# Patient Record
Sex: Female | Born: 1975 | Race: White | Hispanic: No | Smoking: Never smoker
Health system: Southern US, Community
[De-identification: ages and names within clinical notes are randomized; demographics above are authoritative.]

## PROBLEM LIST (undated history)

## (undated) DIAGNOSIS — F32A Depression, unspecified: Secondary | ICD-10-CM

## (undated) HISTORY — PX: ELBOW ARTHROPLASTY: SHX928

## (undated) HISTORY — DX: Depression, unspecified: F32.A

---

## 1998-03-05 ENCOUNTER — Inpatient Hospital Stay (HOSPITAL_COMMUNITY): Admission: AD | Admit: 1998-03-05 | Discharge: 1998-03-05 | Payer: Self-pay | Admitting: *Deleted

## 1998-03-13 ENCOUNTER — Inpatient Hospital Stay (HOSPITAL_COMMUNITY): Admission: AD | Admit: 1998-03-13 | Discharge: 1998-03-17 | Payer: Self-pay | Admitting: Obstetrics and Gynecology

## 2000-02-18 ENCOUNTER — Other Ambulatory Visit: Admission: RE | Admit: 2000-02-18 | Discharge: 2000-02-18 | Payer: Self-pay | Admitting: *Deleted

## 2000-09-08 ENCOUNTER — Inpatient Hospital Stay (HOSPITAL_COMMUNITY): Admission: AD | Admit: 2000-09-08 | Discharge: 2000-09-11 | Payer: Self-pay | Admitting: *Deleted

## 2000-10-23 ENCOUNTER — Other Ambulatory Visit: Admission: RE | Admit: 2000-10-23 | Discharge: 2000-10-23 | Payer: Self-pay | Admitting: *Deleted

## 2001-06-09 ENCOUNTER — Ambulatory Visit (HOSPITAL_COMMUNITY): Admission: RE | Admit: 2001-06-09 | Discharge: 2001-06-09 | Payer: Self-pay | Admitting: Obstetrics and Gynecology

## 2001-11-11 ENCOUNTER — Other Ambulatory Visit: Admission: RE | Admit: 2001-11-11 | Discharge: 2001-11-11 | Payer: Self-pay | Admitting: *Deleted

## 2005-03-14 ENCOUNTER — Other Ambulatory Visit: Admission: RE | Admit: 2005-03-14 | Discharge: 2005-03-14 | Payer: Self-pay | Admitting: Obstetrics and Gynecology

## 2010-03-07 ENCOUNTER — Encounter: Admission: RE | Admit: 2010-03-07 | Discharge: 2010-03-07 | Payer: Self-pay | Admitting: Obstetrics and Gynecology

## 2010-09-13 NOTE — Op Note (Signed)
[facility]  Patient:    Stacy Chandler, Stacy Chandler Visit Number: [id number] MRN: [id number]          Service Type: DSU Location: [facility] Attending Physician:  Marcelle Overlie Dictated by:   Marcelle Overlie, M.D. Proc. Date: 06/09/01 Admit Date:  06/09/2001                             Operative Report  PREOPERATIVE DIAGNOSIS:       Desires permanent sterilization.  POSTOPERATIVE DIAGNOSES:      Desires permanent sterilization.  PROCEDURE:                    Laparoscopic bilateral tubal ligation.  SURGEON:                      Marcelle Overlie, M.D.  ANESTHESIA:                   General.  ESTIMATED BLOOD LOSS:         Minimal.  PROCEDURE:                    Patient was taken to the operating room where she was intubated without difficulty.  She was then placed in the lithotomy position.  The abdomen, vagina, and vulva were prepped and draped in the usual sterile fashion and in-and-out catheter was used to empty the bladder.  The cervix was identified with a speculum and uterine manipulator was inserted into the uterus.  Attention was then turned to the abdomen where a small transverse incision was made at the umbilicus and the Verres needle was inserted into the peritoneal cavity with one attempt.  Pneumoperitoneum was achieved with maximal pressure of 15 mmHg.  The 10-11 mm trocar was inserted through the same incision.  The laparoscope was immediately introduced into the abdominal cavity.  There were no adhesions noted.  No bleeding or injury to the internal organs.  The patient was then placed in a Trendelenburg position and the pelvic organs were then inspected and noted to be normal uterus.  There were no adhesions noted.  Of note, patient had previously had a cesarean section.  Fallopian tubes appeared normal.  Bilateral fimbriated ends were visualized.  Bilateral ovaries appeared normal.  There were no adhesions in the cul-de-sac.  A secondary trocar  site was placed under direct guidance using a 5 mm trocar suprapubically and then using the Klepinger forceps the tubes were each sequentially lifted up and approximately a 3 cm mid portion segment was then electrocoagulated with the tension ______ went down to 0. After a good triple burn electrocautery this was performed on each side. Photographs were taken.  All instruments were removed from the abdomen and the pneumoperitoneum was released.  The incisions were closed using interrupted 3-0 Vicryl suture and local was placed at each incision site.  All sponge, lap, and instrument counts were correct x2.  Patient tolerated the procedure well and went to recovery room in stable condition. Dictated by:   Marcelle Overlie, M.D. Attending Physician:  Marcelle Overlie DD:  06/09/01 TD:  06/09/01 Job: [id number]

## 2010-09-13 NOTE — Op Note (Signed)
[facility]  Patient:    Stacy Chandler, Stacy Chandler                        MRN: [id number] Proc. Date: 09/08/00 Adm. Date:  [id number] Attending:  Donne Hazel                           Operative Report  PREOPERATIVE DIAGNOSES:       1. Intrauterine pregnancy at term.                               2. Repeat low transverse cesarean section.  POSTOPERATIVE DIAGNOSES:      1. Intrauterine pregnancy at term.                               2. Repeat low transverse cesarean section.  PROCEDURE:                    Repeat low transverse cesarean section.  SURGEON:                      Willey Blade, M.D.  ANESTHESIA:                   Spinal.  ESTIMATED BLOOD LOSS:         900 cc.  COMPLICATIONS:                None.  FINDINGS:                     At 32, through a low transverse incision, a viable female infant was delivered from the vertex presentation.  The babys weight is pending.  Apgars were 8 and 9.  The baby did well.  The pelvis was visualized at the time of surgery and noted to be normal.  DESCRIPTION OF PROCEDURE:     The patient was taken to the operating room, where a spinal anesthetic was administered.  The patient was placed on the operating table in the left lateral tilt position.  The abdomen was prepped and draped in the usual sterile fashion with Betadine and sterile drapes.  a Foley catheter was inserted.  The abdomen was entered through a low transverse incision and carried down sharply in the usual fashion.  The peritoneum was atraumatically entered.  The vesical peritoneum overlying the lower uterine segment was incised transversely and a bladder flap was bluntly and sharply created over the lower uterine segment.  A bladder blade was placed behind the bladder to ensure its protection during the procedure.  The uterus was then entered through a low transverse incision and carried out laterally using the operators fingers.  The  membranes were entered with clear fluid noted.  A Mityvac vacuum extraction device was used to elevate the head into the incision and the baby was promptly and easily delivered at 0753.  The oral and nasopharynx were thoroughly bulb suctioned and the cord doubly clamped and cut.  The baby was handed promptly to the pediatricians.  Apgars were 8 and 9. The babys weight is pending.  The baby did well.  The placenta was then manually extracted intact with three-vessel cord without difficulty.  The interior of the uterus was wiped thoroughly clean with a wet  sponge.  The uterine incision was then closed in a two-layer fashion.  The first layer was a running interlocking suture of #1 Vicryl.  A second imbricating suture was placed across the primary suture line with a running stitch of #1 Vicryl, as well.  The pelvis was then thoroughly irrigated and good hemostasis was noted.  The rectus muscle and anterior peritoneum was then closed with a running stitch with #1 Vicryl.  Good hemostasis was noted in the subfascial areas.  Prior to closure of the abdomen, the pelvis was thoroughly irrigated.  The pelvis was visualized and noted to be normal.  Next, the fascia was then closed with a running stitch of 0 Panacryl.  The subfascial areas were irrigated and made hemostatic using the Bovie cautery. The skin was reapproximated with staples and a sterile dressing applied.  Final sponge, needle and instrument counts were correct x 3,  There were no perioperative complications.  The patient did receive an antibiotic post cord clamp. DD:  09/08/00 TD:  09/08/00 Job: [id number]

## 2010-09-13 NOTE — Discharge Summary (Signed)
[facility]  Patient:    Stacy Chandler, Stacy Chandler                        MRN: [id number] Adm. Date:  09/08/00 Disc. Date: 09/11/00 Attending:  Donne Hazel Dictator:   Danie Chandler, R.N.                           Discharge Summary  ADMITTING DIAGNOSES:          1. Intrauterine pregnancy at term.                               2. ______ low transverse cesarean section.  DISCHARGE DIAGNOSES:          1. Intrauterine pregnancy at term.                               2. Repeat low transverse cesarean section.  PROCEDURE:                    On Sep 08, 2000: Repeat low transverse cesarean                               section.  REASON FOR ADMISSION:         Please see H&P.  HOSPITAL COURSE:              The patient was taken to the operating room and underwent the above-named procedure without complication. This was productive of a viable female infant with Apgars of 8 at one minute and 9 at five minutes. Postoperatively on day #1, the patient had adequate pain control. Her hemoglobin was 10.6, hematocrit 31.9, and white blood cell count 11.8. On postoperative day #2, the patient was tolerating regular diet and had a good return of bowel function. She was also ambulating well without difficulty. She was discharged home on postoperative day #3.  CONDITION ON DISCHARGE:       Good.  DIET:                         Regular as tolerated.  ACTIVITY:                     No heavy lifting, no driving, no vaginal entry.  DISCHARGE FOLLOWUP:           She is to follow up in the office for one to two weeks for incision check.  DISCHARGE INSTRUCTIONS:       She is to call for temperature greater than 100 degrees, persistent nausea or vomiting, heavy vaginal bleeding, and/or redness or drainage from the incision site.  DISCHARGE MEDICATIONS:        1. Prenatal vitamin one p.o. q.d.                               2. Tylox, #25, one to two p.o. q.4-6h.                      p.r.n. pain. DD:  09/11/00 TD:  09/11/00 Job: [id number]

## 2010-10-04 ENCOUNTER — Other Ambulatory Visit: Payer: Self-pay | Admitting: Obstetrics and Gynecology

## 2010-10-04 DIAGNOSIS — N6320 Unspecified lump in the left breast, unspecified quadrant: Secondary | ICD-10-CM

## 2010-10-14 ENCOUNTER — Ambulatory Visit
Admission: RE | Admit: 2010-10-14 | Discharge: 2010-10-14 | Disposition: A | Discharge status: Home / Self Care | Payer: 59 | Source: Ambulatory Visit | Attending: Obstetrics and Gynecology | Admitting: Obstetrics and Gynecology

## 2010-10-14 ENCOUNTER — Other Ambulatory Visit: Payer: Self-pay | Admitting: Obstetrics and Gynecology

## 2010-10-14 DIAGNOSIS — N6320 Unspecified lump in the left breast, unspecified quadrant: Secondary | ICD-10-CM

## 2011-01-02 ENCOUNTER — Other Ambulatory Visit: Payer: Self-pay | Admitting: Obstetrics and Gynecology

## 2011-01-02 DIAGNOSIS — R922 Inconclusive mammogram: Secondary | ICD-10-CM

## 2011-01-29 ENCOUNTER — Ambulatory Visit
Admission: RE | Admit: 2011-01-29 | Discharge: 2011-01-29 | Disposition: A | Discharge status: Home / Self Care | Payer: 59 | Source: Ambulatory Visit | Attending: Obstetrics and Gynecology | Admitting: Obstetrics and Gynecology

## 2011-01-29 DIAGNOSIS — R922 Inconclusive mammogram: Secondary | ICD-10-CM

## 2012-05-14 ENCOUNTER — Other Ambulatory Visit: Payer: Self-pay | Admitting: Obstetrics and Gynecology

## 2012-05-14 DIAGNOSIS — N63 Unspecified lump in unspecified breast: Secondary | ICD-10-CM

## 2012-05-20 ENCOUNTER — Other Ambulatory Visit: Payer: Self-pay | Admitting: Obstetrics and Gynecology

## 2012-05-20 ENCOUNTER — Ambulatory Visit
Admission: RE | Admit: 2012-05-20 | Discharge: 2012-05-20 | Disposition: A | Discharge status: Home / Self Care | Payer: 59 | Source: Ambulatory Visit | Attending: Obstetrics and Gynecology | Admitting: Obstetrics and Gynecology

## 2012-05-20 DIAGNOSIS — N63 Unspecified lump in unspecified breast: Secondary | ICD-10-CM

## 2014-06-25 ENCOUNTER — Emergency Department: Payer: Self-pay | Admitting: Internal Medicine

## 2014-06-27 ENCOUNTER — Ambulatory Visit: Payer: Self-pay | Admitting: Surgery

## 2014-08-27 NOTE — Op Note (Signed)
PATIENT NAME:  Stacy Chandler, Stacy Chandler MR#:  [id number] DATE OF BIRTH:  12-19-1975  DATE OF PROCEDURE:  06/27/2014  PREOPERATIVE DIAGNOSIS: Left proximal ulnar and radial neck fractures.   POSTOPERATIVE DIAGNOSIS:  Left proximal ulnar and radial neck fractures.  PROCEDURE: Open reduction and internal fixation of left proximal humerus fracture using a Biomet short precontoured proximal ulnar locking plate.   SURGEON:  Maryagnes Amos, M.D.   ASSISTANT: Devota Pace, NP.   ANESTHESIA: General endotracheal.   FINDINGS: As noted above.   COMPLICATIONS: None.   ESTIMATED BLOOD LOSS: Less than 25 mL.  TOTAL FLUIDS: 1000 mL of crystalloid.   TOURNIQUET TIME:  60 minutes at 250 mmHg.   DRAINS: None.   CLOSURE: Staples.   BRIEF CLINICAL NOTE: The patient is a 39 year old female who sustained the above-noted injury 2 days ago when she slipped and fell in her shower, landing on her left elbow. She presented to the Emergency Room where x-rays demonstrated the above noted findings. She was placed in a posterior splint and followed up in the office yesterday. She presents at this time for definitive management of her injury. Of note, the radial neck fracture is impacted, but overall well aligned, so it is not addressed.   PROCEDURE: The patient was brought into the Operating Room and lain in the supine position. After adequate general endotracheal intubation and anesthesia were obtained, the patient was rolled into the right lateral decubitus position and secured using a beanbag positioner. The left upper extremity was placed over a bolster. The left upper extremity was prepped with ChloraPrep solution before being draped sterilely. Preoperative antibiotics were administered. The limb was exsanguinated with an Esmarch and the tourniquet inflated to 250 mmHg. A curvilinear incision was made over the posterior aspect of the elbow and carried down through the subcutaneous tissues to expose the fascia overlying  the proximal end of the ulna. This was divided subperiosteally to expose the proximal end of the ulnar shaft as well as the olecranon process. The fracture was identified. It was debrided of soft tissues before it was aligned and temporarily stabilized with bone clamps. A short Biomet precontoured proximal ulna locking plate was applied over it. There were 3 holes distal to the fracture, so this was felt to be appropriate. A single locking screw was placed through the olecranon proximally. A bicortical screw was then placed in the slot distally in an eccentric fashion to allow compression across the fracture as it was tightened. Once the plate was aligned properly over the ulnar shaft, this was tightened securely to stabilize the fracture. The adequacy of fracture reduction was verified fluoroscopically in AP and lateral projections and found to be excellent. Next, the "homerun screw" was placed through the posterior aspect of the olecranon across the fracture deep to the joint so that it exited in the anterior cortex of the ulna. This screw was measured and a locking screw of appropriate length was positioned and tightened securely. Three additional screws were placed proximally, all of which were locking. Distally, two additional locking screws were placed bicortically. The adequacy of plate fixation, hardware position, and fracture reduction again was verified for using FluoroScan imaging in AP and lateral projections and found to be excellent.   The wound was copiously irrigated with sterile saline solution before the deep fascial layer was reapproximated using 2-0 Vicryl interrupted sutures. The subcutaneous tissues were closed using 2-0 Vicryl interrupted sutures before the skin was closed using staples. A total of 20 mL  of 0.5% Sensorcaine with epinephrine was injected in and around the incision to help with postoperative analgesia before a sterile bulky dressing was applied. The patient was placed into a  posterior splint with a sugar tong supplement before she was awakened, extubated, and returned to the recovery room in satisfactory condition after tolerating the procedure well.   ____________________________ J. Derald MacleodJeffrey Poggi, MD jjp:mc D: 06/27/2014 13:30:24 ET T: 06/27/2014 13:53:54 ET JOB#: [id number]  cc: Maryagnes AmosJ. Jeffrey Poggi, MD, <Dictator> Maryagnes AmosJ. JEFFREY POGGI MD ELECTRONICALLY SIGNED 06/29/2014 10:23

## 2015-01-03 ENCOUNTER — Emergency Department
Admission: EM | Admit: 2015-01-03 | Discharge: 2015-01-03 | Disposition: A | Discharge status: Home / Self Care | Payer: No Typology Code available for payment source | Attending: Emergency Medicine | Admitting: Emergency Medicine

## 2015-01-03 DIAGNOSIS — J020 Streptococcal pharyngitis: Secondary | ICD-10-CM | POA: Insufficient documentation

## 2015-01-03 DIAGNOSIS — J029 Acute pharyngitis, unspecified: Secondary | ICD-10-CM | POA: Diagnosis present

## 2015-01-03 MED ORDER — AMOXICILLIN 500 MG PO CAPS
500.0000 mg | ORAL_CAPSULE | Freq: Once | ORAL | Status: AC
  Administered 2015-01-03: 500 mg via ORAL

## 2015-01-03 MED ORDER — AMOXICILLIN 500 MG PO TABS: 500.0000 mg | ORAL_TABLET | Freq: Two times a day (BID) | ORAL | Status: DC

## 2015-01-03 MED ORDER — IBUPROFEN 800 MG PO TABS
800.0000 mg | ORAL_TABLET | Freq: Once | ORAL | Status: AC
  Administered 2015-01-03: 800 mg via ORAL
  Filled 2015-01-03: qty 1

## 2015-01-03 MED ORDER — IBUPROFEN 800 MG PO TABS: 800.0000 mg | ORAL_TABLET | Freq: Three times a day (TID) | ORAL | Status: DC | PRN

## 2015-01-03 MED ORDER — FLUCONAZOLE 150 MG PO TABS: 150.0000 mg | ORAL_TABLET | Freq: Every day | ORAL | Status: DC

## 2015-01-03 MED ORDER — AMOXICILLIN 500 MG PO CAPS
ORAL_CAPSULE | ORAL | Status: AC
  Filled 2015-01-03: qty 1

## 2015-01-03 NOTE — ED Notes (Signed)
POCT strep - negative

## 2015-01-03 NOTE — ED Provider Notes (Signed)
[facility] Emergency Department Provider Note     Time seen: ----------------------------------------- 10:25 PM on 01/03/2015 -----------------------------------------    I have reviewed the triage vital signs and the nursing notes.   HISTORY  Chief Complaint Sore Throat    HPI Stacy Chandler is a 39 y.o. female who presents ER sore throat since yesterday, no fever. She denies any runny nose, congestion, cough.   No past medical history on file.  There are no active problems to display for this patient.   No past surgical history on file.  Allergies Vicodin  Social History Social History  Substance Use Topics  . Smoking status: Not on file  . Smokeless tobacco: Not on file  . Alcohol Use: Not on file    Review of Systems Constitutional: Negative for fever. Eyes: Negative for visual changes. ENT: Positive for sore throat Cardiovascular: Negative for chest pain. Respiratory: Negative for shortness of breath. Gastrointestinal: Negative for abdominal pain, vomiting and diarrhea. Genitourinary: Negative for dysuria. Musculoskeletal: Negative for back pain. Skin: Negative for rash. Neurological: Negative for headaches, focal weakness or numbness.  10-point ROS otherwise negative.  ____________________________________________   PHYSICAL EXAM:  VITAL SIGNS: ED Triage Vitals  Enc Vitals Group     BP 01/03/15 2050 132/69 mmHg     Pulse Rate 01/03/15 2050 94     Resp 01/03/15 2050 18     Temp 01/03/15 2050 98.6 F (37 C)     Temp Source 01/03/15 2050 Oral     SpO2 01/03/15 2050 99 %     Weight 01/03/15 2054 200 lb (90.719 kg)     Height 01/03/15 2054  (1.575 m)     Head Cir --      Peak Flow --      Pain Score 01/03/15 2054 10     Pain Loc --      Pain Edu? --      Excl. in GC? --     Constitutional: Alert and oriented. Well appearing and in no distress. Eyes: Conjunctivae are normal. PERRL. Normal extraocular  movements. ENT   Head: Normocephalic and atraumatic.   Nose: No congestion/rhinnorhea.   Mouth/Throat: Mucous membranes are moist. Mildly erythematous posterior pharynx   Neck: No stridor. Mild anterior cervical adenopathy Cardiovascular: Normal rate, regular rhythm. Normal and symmetric distal pulses are present in all extremities. No murmurs, rubs, or gallops. Respiratory: Normal respiratory effort without tachypnea nor retractions. Breath sounds are clear and equal bilaterally. No wheezes/rales/rhonchi. Gastrointestinal: Soft and nontender. No distention. No abdominal bruits.  Musculoskeletal: Nontender with normal range of motion in all extremities. No joint effusions.  No lower extremity tenderness nor edema. Neurologic:  Normal speech and language. No gross focal neurologic deficits are appreciated. Speech is normal. No gait instability. Skin:  Skin is warm, dry and intact. No rash noted. Psychiatric: Mood and affect are normal. Speech and behavior are normal. Patient exhibits appropriate insight and judgment. ____________________________________________  ED COURSE:  Pertinent labs & imaging results that were available during my care of the patient were reviewed by me and considered in my medical decision making (see chart for details). Findings resemble viral pharyngitis, we'll check rapid strep. ____________________________________________    LABS (pertinent positives/negatives)  Rapid strep test is positive  ____________________________________________  FINAL ASSESSMENT AND PLAN  Strep pharyngitis  Plan: Patient with labs and imaging as dictated above. Although it does not truly resemble strep, she'll be given amoxicillin, Diflucan as she states antibiotic skin for yeast  infections and Motrin for pain. She stable for outpatient follow-up   Emily Filbert, MD   Emily Filbert, MD 01/03/15 2227

## 2015-01-03 NOTE — Discharge Instructions (Signed)

## 2015-01-03 NOTE — ED Notes (Signed)
Pt in with co sore throat since yest no fever.

## 2015-01-09 LAB — POCT RAPID STREP A: Streptococcus, Group A Screen (Direct): POSITIVE — AB

## 2015-07-24 ENCOUNTER — Other Ambulatory Visit: Payer: Self-pay | Admitting: Obstetrics and Gynecology

## 2015-07-24 DIAGNOSIS — N644 Mastodynia: Secondary | ICD-10-CM

## 2015-07-30 ENCOUNTER — Other Ambulatory Visit: Payer: Self-pay

## 2015-08-07 ENCOUNTER — Ambulatory Visit
Admission: RE | Admit: 2015-08-07 | Discharge: 2015-08-07 | Disposition: A | Discharge status: Home / Self Care | Payer: BLUE CROSS/BLUE SHIELD | Source: Ambulatory Visit | Attending: Obstetrics and Gynecology | Admitting: Obstetrics and Gynecology

## 2015-08-07 DIAGNOSIS — N644 Mastodynia: Secondary | ICD-10-CM

## 2015-10-17 ENCOUNTER — Encounter: Payer: Self-pay | Admitting: Emergency Medicine

## 2015-10-17 ENCOUNTER — Ambulatory Visit
Admission: EM | Admit: 2015-10-17 | Discharge: 2015-10-17 | Disposition: A | Discharge status: Home / Self Care | Payer: BLUE CROSS/BLUE SHIELD | Attending: Family Medicine | Admitting: Family Medicine

## 2015-10-17 DIAGNOSIS — J029 Acute pharyngitis, unspecified: Secondary | ICD-10-CM | POA: Diagnosis not present

## 2015-10-17 MED ORDER — PENICILLIN G BENZATHINE 1200000 UNIT/2ML IM SUSP
1.2000 10*6.[IU] | Freq: Once | INTRAMUSCULAR | Status: AC
  Administered 2015-10-17: 1.2 10*6.[IU] via INTRAMUSCULAR

## 2015-10-17 MED ORDER — FLUCONAZOLE 150 MG PO TABS: 150.0000 mg | ORAL_TABLET | Freq: Every day | ORAL | Status: DC

## 2015-10-17 NOTE — ED Notes (Signed)
Sore throat, fever, chills, sweating for 2 days

## 2015-10-17 NOTE — Discharge Instructions (Signed)

## 2015-10-17 NOTE — ED Notes (Signed)
San JettyLaurie Neese, RN checked patient and injection site and it appears to be unremarkable. Patient feels well.

## 2015-10-17 NOTE — ED Provider Notes (Signed)
CSN: [id number]     Arrival date & time 10/17/15  1506 History   First MD Initiated Contact with Patient 10/17/15 1634     Chief Complaint  Patient presents with  . Sore Throat   (Consider location/radiation/quality/duration/timing/severity/associated sxs/prior Treatment) Patient is a 40 y.o. female presenting with URI. The history is provided by the patient.  URI Presenting symptoms: fever and sore throat   Severity:  Moderate Onset quality:  Sudden Duration:  2 days Timing:  Constant Progression:  Worsening Relieved by:  Nothing Ineffective treatments:  OTC medications Associated symptoms: swollen glands   Associated symptoms: no headaches, no sinus pain and no wheezing   Risk factors: not elderly, no chronic cardiac disease, no chronic kidney disease, no chronic respiratory disease, no diabetes mellitus, no immunosuppression, no recent illness, no recent travel and no sick contacts     History reviewed. No pertinent past medical history. Past Surgical History  Procedure Laterality Date  . Elbow arthroplasty Left    No family history on file. Social History  Substance Use Topics  . Smoking status: Never Smoker   . Smokeless tobacco: Never Used  . Alcohol Use: Yes   OB History    No data available     Review of Systems  Constitutional: Positive for fever.  HENT: Positive for sore throat.   Respiratory: Negative for wheezing.   Neurological: Negative for headaches.    Allergies  Vicodin  Home Medications   Prior to Admission medications   Medication Sig Start Date End Date Taking? Authorizing Provider  amoxicillin (AMOXIL) 500 MG tablet Take 1 tablet (500 mg total) by mouth 2 (two) times daily. 01/03/15   Emily Filbert, MD  fluconazole (DIFLUCAN) 150 MG tablet Take 1 tablet (150 mg total) by mouth daily. 10/17/15   Payton Mccallum, MD  ibuprofen (ADVIL,MOTRIN) 800 MG tablet Take 1 tablet (800 mg total) by mouth every 8 (eight) hours as needed. 01/03/15   Emily Filbert, MD   Meds Ordered and Administered this Visit   Medications  penicillin g benzathine (BICILLIN LA) 1200000 UNIT/2ML injection 1.2 Million Units (1.2 Million Units Intramuscular Given 10/17/15 1646)    BP 115/62 mmHg  Pulse 82  Temp(Src) 98.2 F (36.8 C)  Resp 16  Ht  (1.575 m)  Wt 220 lb (99.791 kg)  BMI 40.23 kg/m2  SpO2 97% No data found.   Physical Exam  Constitutional: She appears well-developed and well-nourished. No distress.  HENT:  Head: Normocephalic and atraumatic.  Right Ear: Tympanic membrane, external ear and ear canal normal.  Left Ear: Tympanic membrane, external ear and ear canal normal.  Nose: Mucosal edema and rhinorrhea present. No nose lacerations, sinus tenderness, nasal deformity, septal deviation or nasal septal hematoma. No epistaxis.  No foreign bodies. Right sinus exhibits maxillary sinus tenderness and frontal sinus tenderness. Left sinus exhibits maxillary sinus tenderness and frontal sinus tenderness.  Mouth/Throat: Uvula is midline and mucous membranes are normal. Oropharyngeal exudate and posterior oropharyngeal erythema present. No posterior oropharyngeal edema or tonsillar abscesses.  Eyes: Conjunctivae and EOM are normal. Pupils are equal, round, and reactive to light. Right eye exhibits no discharge. Left eye exhibits no discharge. No scleral icterus.  Neck: Normal range of motion. Neck supple. No thyromegaly present.  Cardiovascular: Normal rate, regular rhythm and normal heart sounds.   Pulmonary/Chest: Effort normal and breath sounds normal. No respiratory distress. She has no wheezes. She has no rales.  Lymphadenopathy:    She has cervical adenopathy.  Skin: She is not diaphoretic.  Nursing note and vitals reviewed.   ED Course  Procedures (including critical care time)  Labs Review Labs Reviewed  RAPID STREP SCREEN (NOT AT [facility])    Imaging Review No results found.   Visual Acuity Review  Right Eye Distance:    Left Eye Distance:   Bilateral Distance:    Right Eye Near:   Left Eye Near:    Bilateral Near:         MDM   1. Pharyngitis    Discharge Medication List as of 10/17/2015  4:41 PM     1. diagnosis reviewed with patient 2.patient given Bicillin LA 1.762mU IM x 1 3. Recommend supportive treatment with salt water gargles, otc analgesics prn 4. Follow-up prn if symptoms worsen or don't improve    Payton Mccallumrlando Odessa Nishi, MD 10/17/15 1826

## 2021-01-09 ENCOUNTER — Telehealth: Payer: Self-pay

## 2021-01-09 NOTE — Telephone Encounter (Signed)
Left vm to confirm 01/10/21 appointment-Toni 

## 2021-01-10 ENCOUNTER — Ambulatory Visit (INDEPENDENT_AMBULATORY_CARE_PROVIDER_SITE_OTHER): Payer: BC Managed Care – PPO | Admitting: Physician Assistant

## 2021-01-10 ENCOUNTER — Encounter: Payer: Self-pay | Admitting: Physician Assistant

## 2021-01-10 ENCOUNTER — Other Ambulatory Visit: Payer: Self-pay

## 2021-01-10 DIAGNOSIS — F331 Major depressive disorder, recurrent, moderate: Secondary | ICD-10-CM | POA: Diagnosis not present

## 2021-01-10 DIAGNOSIS — Z3041 Encounter for surveillance of contraceptive pills: Secondary | ICD-10-CM

## 2021-01-10 DIAGNOSIS — F411 Generalized anxiety disorder: Secondary | ICD-10-CM | POA: Diagnosis not present

## 2021-01-10 DIAGNOSIS — G4710 Hypersomnia, unspecified: Secondary | ICD-10-CM | POA: Diagnosis not present

## 2021-01-10 DIAGNOSIS — Z7689 Persons encountering health services in other specified circumstances: Secondary | ICD-10-CM

## 2021-01-10 DIAGNOSIS — Z23 Encounter for immunization: Secondary | ICD-10-CM

## 2021-01-10 MED ORDER — VENLAFAXINE HCL 75 MG PO TABS: 75.0000 mg | ORAL_TABLET | Freq: Every day | ORAL | 1 refills | Status: DC

## 2021-01-10 MED ORDER — NORETHIN ACE-ETH ESTRAD-FE 1-20 MG-MCG(24) PO TABS: 1.0000 | ORAL_TABLET | Freq: Every day | ORAL | 2 refills | Status: DC

## 2021-01-10 MED ORDER — ESCITALOPRAM OXALATE 20 MG PO TABS: 20.0000 mg | ORAL_TABLET | Freq: Every day | ORAL | 1 refills | Status: DC

## 2021-01-10 NOTE — Progress Notes (Signed)
[facility] [address] [place], [place] [address]  Internal MEDICINE  Office Visit Note  Patient Name: Stacy Chandler  [id number]  [id number]  Date of Service: 01/13/2021   Complaints/HPI Pt is here for establishment of PCP. Chief Complaint  Patient presents with   New Patient (Initial Visit)   Depression   HPI Pt is here to establish care -Patient changed insurance and needs to establish with in-network provider -Takes lexapro and effexor and feels like this is helping with anxiety and depression  -She is taking b12 and vit D because these were low on labs done by previous provider. She is also on birth control pills. -She feels fatigued and has gained some weight. -Does not sleep great. She does snore but no gasping awake. Falls asleep around 8:30-9 and wakes up 5:30. Does toss and turn some. Brother has OSA. Can fall asleep during lunch break and usually has to set an alarm to get back to work on time. -She has 2 daughters, 19 and 22yo. 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   Cyanocobalamin (B-12 PO) Take by mouth.   Ergocalciferol (VITAMIN D2 PO) Take by mouth.   [DISCONTINUED] escitalopram (LEXAPRO) 20 MG tablet Take 20 mg by mouth daily.   [DISCONTINUED] Norethindrone Acetate-Ethinyl Estrad-FE (LOESTRIN 24  FE) 1-20 MG-MCG(24) tablet Take 1 tablet by mouth daily.   [DISCONTINUED] venlafaxine (EFFEXOR) 75 MG tablet Take 75 mg by mouth 2 (two) times daily.   escitalopram (LEXAPRO) 20 MG tablet Take 1 tablet (20 mg total) by mouth daily.   Norethindrone Acetate-Ethinyl Estrad-FE (LOESTRIN 24 FE) 1-20 MG-MCG(24) tablet Take 1 tablet by mouth daily.   venlafaxine (EFFEXOR) 75 MG tablet Take 1 tablet (75 mg total) by mouth daily.   [DISCONTINUED] amoxicillin (AMOXIL) 500 MG tablet Take 1 tablet (500 mg total) by mouth 2 (two) times daily.   [DISCONTINUED] fluconazole (DIFLUCAN) 150 MG tablet Take 1 tablet (150 mg total) by mouth daily.   [DISCONTINUED] ibuprofen (ADVIL,MOTRIN) 800 MG tablet Take 1 tablet (800 mg total) by mouth every 8 (eight) hours as needed.   No facility-administered encounter medications on file as of 01/10/2021.    Surgical History: Past Surgical History:  Procedure Laterality Date   CESAREAN SECTION     ELBOW ARTHROPLASTY Left     Medical History: Past Medical History:  Diagnosis Date   Depression     Family History: Family History  Problem Relation Age of Onset   Heart disease Mother    Diabetes Mother    Cancer Mother    Schizophrenia Brother    Cancer Maternal Grandmother    Cancer Paternal Grandmother  Social History   Socioeconomic History   Marital status: Married    Spouse name: Not on file   Number of children: Not on file   Years of education: Not on file   Highest education level: Not on file  Occupational History   Not on file  Tobacco Use   Smoking status: Never   Smokeless tobacco: Never  Substance and Sexual Activity   Alcohol use: Yes   Drug use: No   Sexual activity: Not on file  Other Topics Concern   Not on file  Social History Narrative   Not on file   Social Determinants of Health   Financial Resource Strain: Not on file  Food Insecurity: Not on file  Transportation Needs: Not on file  Physical Activity: Not on file   Stress: Not on file  Social Connections: Not on file  Intimate Partner Violence: Not on file     Review of Systems  Constitutional:  Positive for fatigue. Negative for chills and unexpected weight change.  HENT:  Negative for congestion, postnasal drip, rhinorrhea, sneezing and sore throat.   Eyes:  Negative for redness.  Respiratory:  Negative for cough, chest tightness and shortness of breath.   Cardiovascular:  Negative for chest pain and palpitations.  Gastrointestinal:  Negative for abdominal pain, constipation, diarrhea, nausea and vomiting.  Genitourinary:  Negative for dysuria and frequency.  Musculoskeletal:  Negative for arthralgias, back pain, joint swelling and neck pain.  Skin:  Negative for rash.  Neurological: Negative.  Negative for tremors and numbness.  Hematological:  Negative for adenopathy. Does not bruise/bleed easily.  Psychiatric/Behavioral:  Positive for behavioral problems (Depression) and sleep disturbance. Negative for suicidal ideas. The patient is nervous/anxious.    Vital Signs: BP 134/72   Pulse 95   Temp 98.1 F (36.7 C)   Resp 16   Ht 5\' 2"  (1.575 m)   Wt 262 lb 3.2 oz (118.9 kg)   SpO2 98%   BMI 47.96 kg/m    Physical Exam Vitals and nursing note reviewed.  Constitutional:      General: She is not in acute distress.    Appearance: She is well-developed. She is obese. She is not diaphoretic.  HENT:     Head: Normocephalic and atraumatic.     Mouth/Throat:     Pharynx: No oropharyngeal exudate.  Eyes:     Pupils: Pupils are equal, round, and reactive to light.  Neck:     Thyroid: No thyromegaly.     Vascular: No JVD.     Trachea: No tracheal deviation.  Cardiovascular:     Rate and Rhythm: Normal rate and regular rhythm.     Heart sounds: Normal heart sounds. No murmur heard.   No friction rub. No gallop.  Pulmonary:     Effort: Pulmonary effort is normal. No respiratory distress.     Breath sounds: No wheezing or rales.   Chest:     Chest wall: No tenderness.  Abdominal:     General: Bowel sounds are normal.     Palpations: Abdomen is soft.  Musculoskeletal:        General: Normal range of motion.     Cervical back: Normal range of motion and neck supple.  Lymphadenopathy:     Cervical: No cervical adenopathy.  Skin:    General: Skin is warm and dry.  Neurological:     Mental Status: She is alert and oriented to person, place, and time.     Cranial Nerves:  No cranial nerve deficit.  Psychiatric:        Behavior: Behavior normal.        Thought Content: Thought content normal.        Judgment: Judgment normal.      Assessment/Plan: 1. Hypersomnia Based on excessive daytime sleepiness, snoring, and elevated BMI will order PSG for evaluation of OSA. - PSG SLEEP STUDY  2. GAD (generalized anxiety disorder) Stable, will continue effexor and lexapro - venlafaxine (EFFEXOR) 75 MG tablet; Take 1 tablet (75 mg total) by mouth daily.  Dispense: 90 tablet; Refill: 1 - escitalopram (LEXAPRO) 20 MG tablet; Take 1 tablet (20 mg total) by mouth daily.  Dispense: 90 tablet; Refill: 1  3. Moderate episode of recurrent major depressive disorder (HCC) Stable, will continue effexor and lexapro - venlafaxine (EFFEXOR) 75 MG tablet; Take 1 tablet (75 mg total) by mouth daily.  Dispense: 90 tablet; Refill: 1 - escitalopram (LEXAPRO) 20 MG tablet; Take 1 tablet (20 mg total) by mouth daily.  Dispense: 90 tablet; Refill: 1  4. Encounter for surveillance of contraceptive pills - Norethindrone Acetate-Ethinyl Estrad-FE (LOESTRIN 24 FE) 1-20 MG-MCG(24) tablet; Take 1 tablet by mouth daily.  Dispense: 28 tablet; Refill: 2  5. Encounter to establish care with new doctor Pt here to establish care, already had CPE with previous provider this year  6. Needs flu shot - Flu Vaccine MDCK QUAD PF   General Counseling: Arielis verbalizes understanding of the findings of todays visit and agrees with plan of treatment. I have  discussed any further diagnostic evaluation that may be needed or ordered today. We also reviewed her medications today. she has been encouraged to call the office with any questions or concerns that should arise related to todays visit.    Counseling:    Orders Placed This Encounter  Procedures   Flu Vaccine MDCK QUAD PF   PSG SLEEP STUDY    Meds ordered this encounter  Medications   venlafaxine (EFFEXOR) 75 MG tablet    Sig: Take 1 tablet (75 mg total) by mouth daily.    Dispense:  90 tablet    Refill:  1   Norethindrone Acetate-Ethinyl Estrad-FE (LOESTRIN 24 FE) 1-20 MG-MCG(24) tablet    Sig: Take 1 tablet by mouth daily.    Dispense:  28 tablet    Refill:  2   escitalopram (LEXAPRO) 20 MG tablet    Sig: Take 1 tablet (20 mg total) by mouth daily.    Dispense:  90 tablet    Refill:  1     This patient was seen by Lynn Ito, PA-C in collaboration with Dr. Beverely Risen as a part of collaborative care agreement.   Time spent:40 Minutes

## 2021-01-18 ENCOUNTER — Encounter: Payer: Self-pay | Admitting: Physician Assistant

## 2021-01-21 ENCOUNTER — Telehealth: Payer: Self-pay

## 2021-01-21 NOTE — Telephone Encounter (Signed)
Called patietn and advised that we will be sending feeling [facility] an order for a home sleep study. No answer. Left a VM.

## 2021-01-22 NOTE — Telephone Encounter (Signed)
Okay 

## 2021-01-22 NOTE — Telephone Encounter (Signed)
Do we need to cancel her appointment, or do you still want to see her?

## 2021-01-23 ENCOUNTER — Telehealth: Payer: Self-pay

## 2021-01-23 NOTE — Telephone Encounter (Signed)
Patient has been scheduled a PSG for Sunday January 27, 2021. Verified with Betsy at [facility] that the patient will have no cost with a PSG.

## 2021-01-27 ENCOUNTER — Encounter (INDEPENDENT_AMBULATORY_CARE_PROVIDER_SITE_OTHER): Payer: BC Managed Care – PPO | Admitting: Internal Medicine

## 2021-01-27 DIAGNOSIS — G4733 Obstructive sleep apnea (adult) (pediatric): Secondary | ICD-10-CM

## 2021-01-31 DIAGNOSIS — G4733 Obstructive sleep apnea (adult) (pediatric): Secondary | ICD-10-CM | POA: Insufficient documentation

## 2021-01-31 NOTE — Procedures (Signed)
[facility]  Polysomnogram Report Part I                                                                 Phone: [phone] Fax: [phone]  Patient Name: Stacy Chandler, Stacy Chandler Acquisition Number: [id number]  Date of Birth: [birth year] Acquisition Date: 01/27/2021  Referring Physician: Lynn Ito, PA-C     History: The patient is a 45 year old female who was referred for evaluation of possible sleep apnea. Medical History: depression.  Medications: vitamin D3, B-12, D2, Lexapro, loestrin, Effexor.  Procedure: This routine overnight polysomnogram was performed on the Alice 5 using the standard diagnostic protocol. This included 6 channels of EEG, 2 channels of EOG, chin EMG, bilateral anterior tibialis EMG, nasal/oral thermistor, PTAF (nasal pressure transducer), chest and abdominal wall movements, EKG, and pulse oximetry.  Description: The total recording time was 451.9 minutes. The total sleep time was 418.7 minutes. There were a total of 20.5 minutes of wakefulness after sleep onset for a goodsleep efficiency of 92.7%. The latency to sleep onset was within normal limits at 12.7 minutes. The R sleep onset latency was prolonged at 321.0 minutes. Sleep parameters, as a percentage of the total sleep time, demonstrated 4.1% of sleep was in N1 sleep, 83.6% N2, 8.5% N3 and 3.8% R sleep. There were a total of 131 arousals for an arousal index of 18.8 arousals per hour of sleep that was elevated.  Respiratory monitoring demonstrated frequent moderate to severe degree of snoring. Less than 15 minutes of non-supine sleep were observed. There were 75 apneas and hypopneas for an Apnea Hypopnea Index of 10.7 apneas and hypopneas per hour of sleep. The REM related apnea hypopnea index was 45.0/hr of REM sleep compared to a NREM AHI of 9.2/hr.  The average duration of the respiratory events was 22.8 seconds with a maximum duration of 48.5 seconds.  The respiratory events were associated with  peripheral oxygen desaturations on the average to 91%. The lowest oxygen desaturation associated with a respiratory event was 84%. Additionally, the baseline oxygen saturation during wakefulness was 97%, during NREM sleep averaged 96%, and during REM sleep averaged  96%. The total duration of oxygen < 90% was 0.5 minutes.  Cardiac monitoring- did not demonstrate transient cardiac decelerations associated with the apneas. There were no significant cardiac rhythm irregularities.   Periodic limb movement monitoring- demonstrated that there were 87 periodic limb movements for a periodic limb movement index of 12.5 periodic limb movements per hour of sleep.   Impression: This routine overnight polysomnogram demonstrated significant obstructive sleep apnea with an overall Apnea Hypopnea Index of 10.7 apneas and hypopneas per hour of sleep. The respiratory events were more frequent in REM sleep with an AHI of 45.0. As REM percentage was markedly reduced, the findings likely underestimate the severity of the sleep apnea.  There was a slightly elevated periodic limb movement index of 12.5 periodic limb movements per hour of sleep. Sometimes these limb movements subside once the apnea is controlled. Clinical correlation is suggested.  There was an elevated arousal index with reduced percentages of REM and slow wave sleep. These findings would appear to be due to the obstructive sleep apnea.  Recommendations:    A CPAP titration would be recommended due to  the severity of the sleep apnea. Some supine sleep should be ensured to optimize the titration. Additionally, would recommend weight loss in a patient with a BMI of 47.9.     Yevonne Pax, MD, [facility] Diplomate ABMS-Pulmonary, Critical Care and Sleep Medicine  Electronically reviewed and digitally signed   [facility] Polysomnogram Report Part II  Phone: [phone] Fax: [phone]  Patient last name Chandler Neck Size 16.3 in.  Acquisition [id number]  Patient first name Stacy Weight 262.0 lbs. Started 01/27/2021 at 9:23:35 PM  Birth date 04-20-1976 Height 62.0 in. Stopped 01/28/2021 at 5:00:35 AM  Age 61 BMI 47.9 lb/in2 Duration 451.9  Study Type Adult      Report Generated by: Har[place], RPSGT Sleep Data: Lights Out: 9:27:53 PM Sleep Onset: 9:40:35 PM  Lights On: 4:59:47 AM Sleep Efficiency: 92.7 %  Total Recording Time: 451.9 min Sleep Latency (from Lights Off) 12.7 min  Total Sleep Time (TST): 418.7 min R Latency (from Sleep Onset): 321.0 min  Sleep Period Time: 439.2 min Total number of awakenings: 7  Wake during sleep: 20.5 min Wake After Sleep Onset (WASO): 20.5 min   Sleep Data:         Arousal Summary: Stage  Latency from lights out (min) Latency from sleep onset (min) Duration (min) % Total Sleep Time  Normal values  N 1 12.7 0.0 17.0 4.1 (5%)  N 2 23.7 11.0 350.2 83.6 (50%)  N 3 82.2 69.5 35.5 8.5 (20%)  R 333.7 321.0 16.0 3.8 (25%)    Number Index  Spontaneous 91 13.0  Apneas & Hypopneas 24 3.4  RERAs 0 0.0       (Apneas & Hypopneas & RERAs)  (24) (3.4)  Limb Movement 24 3.4  Snore 0 0.0  TOTAL 139 19.9     Respiratory Data:  CA OA MA Apnea Hypopnea* A+ H RERA Total  Number 0 3 0 3 72 75 0 75  Mean Dur (sec) 0.0 16.5 0.0 16.5 23.1 22.8 0.0 22.8  Max Dur (sec) 0.0 19.0 0.0 19.0 48.5 48.5 0.0 48.5  Total Dur (min) 0.0 0.8 0.0 0.8 27.7 28.5 0.0 28.5  % of TST 0.0 0.2 0.0 0.2 6.6 6.8 0.0 6.8  Index (#/h TST) 0.0 0.4 0.0 0.4 10.3 10.7 0.0 10.7  *Hypopneas scored based on 4% or greater desaturation.  Sleep Stage:        REM NREM TST  AHI 45.0 9.2 10.7  RDI 45.0 9.2 10.7           Body Position Data:  Sleep (min) TST (%) REM (min) NREM (min) CA (#) OA (#) MA (#) HYP (#) AHI (#/h) RERA (#) RDI (#/h) Desat (#)  Supine 404.2 96.54 16.0 388.2 0 3 0 71 11.0 0 11.0 78  Non-Supine 14.50 3.46 0.00 14.50 0.00 0.00 0.00 1.00 4.14 0 4.14 1.00  Right: 14.5 3.46 0.0 14.5 0 0 0 1  4.1 0 4.1 1  UP: 0.0 0.00 0.0 0.0 0 0 0 0 0.0 0 0.00 0     Snoring: Total number of snoring episodes  0  Total time with snoring    min (   % of sleep)   Oximetry Distribution:             WK REM NREM TOTAL  Average (%)   97 96 96 96  < 90% 0.1 0.2 0.2 0.5  < 80% 0.0 0.0 0.0 0.0  < 70% 0.0 0.0 0.0 0.0  #  of Desaturations* 1 15 63 79  Desat Index (#/hour) 2.0 56.3 9.4 11.3  Desat Max (%) 4 12 14 14   Desat Max Dur (sec) 19.0 33.0 61.0 61.0  Approx Min O2 during sleep 85  Approx min O2 during a respiratory event 84  Was Oxygen added (Y/N) and final rate No:   0 LPM  *Desaturations based on 4% or greater drop from baseline.   Cheyne Stokes Breathing: None Present   Heart Rate Summary:  Average Heart Rate During Sleep 77.3 bpm      Highest Heart Rate During Sleep (95th %) 85.0 bpm      Highest Heart Rate During Sleep 121 bpm      Highest Heart Rate During Recording (TIB) 169 bpm (artifact)   Heart Rate Observations: Event Type # Events   Bradycardia 0 Lowest HR Scored: N/A  Sinus Tachycardia During Sleep 0 Highest HR Scored: N/A  Narrow Complex Tachycardia 0 Highest HR Scored: N/A  Wide Complex Tachycardia 0 Highest HR Scored: N/A  Asystole 0 Longest Pause: N/A  Atrial Fibrillation 0 Duration Longest Event: N/A  Other Arrythmias  No Type:    Periodic Limb Movement Data: (Primary legs unless otherwise noted) Total # Limb Movement 135 Limb Movement Index 19.3  Total # PLMS 87 PLMS Index 12.5  Total # PLMS Arousals 15 PLMS Arousal Index 2.1  Percentage Sleep Time with PLMS 61. (14.7 % sleep)  Mean Duration limb movements (secs) 308.0

## 2021-02-04 ENCOUNTER — Encounter (INDEPENDENT_AMBULATORY_CARE_PROVIDER_SITE_OTHER): Payer: BC Managed Care – PPO | Admitting: Internal Medicine

## 2021-02-04 DIAGNOSIS — G4733 Obstructive sleep apnea (adult) (pediatric): Secondary | ICD-10-CM

## 2021-02-08 NOTE — Procedures (Signed)
[facility]  Polysomnogram Report Part I  Phone: [phone] Fax: [phone]  Patient Name: Stacy Chandler, Stacy Chandler Acquisition Number: [id number]  Date of Birth: 04-04-1976 Acquisition Date: 02/04/2021  Referring Physician: Lynn Ito, PA-C     History: The patient is a 45 year old female with obstructive sleep apnea for CPAP titration. Medical History: depression.  Medications: vitamin D3, vitamin B12, vitamin D2, Lexapro, loestrin 24 fe, Effexor.  Procedure: This routine overnight polysomnogram was performed on the Alice 5 using the standard CPAP protocol. This included 6 channels of EEG, 2 channels of EOG, chin EMG, bilateral anterior tibialis EMG, nasal/oral thermistor, PTAF (nasal pressure transducer), chest and abdominal wall movements, EKG, and pulse oximetry.  Description: The total recording time was 459.4 minutes. The total sleep time was 404.5 minutes. There were a total of 1.4 minutes of wakefulness after sleep onset for aslightly reducedsleep efficiency of 88.0%. The latency to sleep onset was prolonged at 53.5 minutes. The R sleep onset latency was within normal limits at 105.0 minutes. Sleep parameters, as a percentage of the total sleep time, demonstrated 1.1% of sleep was in N1 sleep, 45.9% N2, 15.0% N3 and 38.1% R sleep. There were a total of 25 arousals for an arousal index of 3.7 arousals per hour of sleep that was normal.  Overall, there were a total of 44 respiratory events for a respiratory disturbance index, which includes apneas, hypopneas and RERAs (increased respiratory effort) of 10.0 respiratory events per hour of sleep during the pressure titration. CPAP was initiated at 7 cm H2O for patient comfort at lights out, 9:23 p.m. It was titrated in 1 cm increments for occasional hypopneas to the final pressure of 11 cm H2O. The apnea was well controlled at the final pressure and REM sleep was observed. All sleep was in the supine position.  Additionally,  the baseline oxygen saturation during wakefulness was 96%, during NREM sleep averaged 95%, and during REM sleep averaged 96%. The total duration of oxygen < 90% was 0.0 minutes.  Cardiac monitoring- There were no significant cardiac rhythm irregularities.   Periodic limb movement monitoring- demonstrated that there were 68 periodic limb movements for a periodic limb movement index of 10.1 periodic limb movements per hour of sleep. Quasi-periodic limb movement were observed during periods of wakefulness.    Impression: This patient's obstructive sleep apnea demonstrated significant improvement with the utilization of nasal CPAP at 11 cm H2O.   There was a slightly elevated periodic limb movement index of 10.1 periodic limb movements per hour of sleep. In addition, quasi-periodic limb movement were observed during periods of wakefulness. These limb movements were also observed during the initial polysomnogram. Treatment may be indicated if sleep disruption or sleepiness persist once the patient is fully compliant with CPAP.  Recommendations: Would recommend utilization of nasal CPAP at 11 cm H2O.      A Simplus mask, size small, was used. Chin strap used during study- no. Humidifier used during study- yes.    Stacy Chandler, Stacy Chandler, Stacy Chandler, Stacy Chandler  Electronically reviewed and digitally signed    [facility] CPAP/BIPAP Polysomnogram Report Part II Phone: [phone] Fax: [phone]  Patient last name Chandler Neck Size 16.3 in. Acquisition [phone]  Patient first name Stacy Weight 262.0 lbs. Started 02/04/2021 at 9:14:13 PM  Birth date 10-21-1975 Height 62.0 in. Stopped 02/05/2021 at 5:03:49 AM  Age 46      Type Adult BMI 47.9  lb/in2 Duration 459.4  Report Generated by: [place], RPSGT Sleep Data: Lights Out: 9:23:43 PM Sleep Onset: 10:17:13 PM  Lights On: 5:03:07 AM Sleep Efficiency: 88.0 %  Total Recording Time: 459.4  min Sleep Latency (from Lights Off) 53.5 min  Total Sleep Time (TST): 404.5 min R Latency (from Sleep Onset): 105.0 min  Sleep Period Time: 405.5 min Total number of awakenings: 2  Wake during sleep: 1.0 min Wake After Sleep Onset (WASO): 1.4 min   Sleep Data:         Arousal Summary: Stage  Latency from lights out (min) Latency from sleep onset (min) Duration (min) % Total Sleep Time  Normal values  N 1 53.5 0.0 4.5 1.1 (5%)  N 2 56.0 2.5 185.5 45.9 (50%)  N 3 71.0 17.5 60.5 15.0 (20%)  R 158.5 105.0 154.0 38.1 (25%)    Number Index  Spontaneous 26 3.9  Apneas & Hypopneas 2 0.3  RERAs 0 0.0       (Apneas & Hypopneas & RERAs)  (2) (0.3)  Limb Movement 1 0.1  Snore 0 0.0  TOTAL 29 4.3     Respiratory Data:  CA OA MA Apnea Hypopnea* A+ H RERA Total  Number 0 0 0 0 16 16 0 16  Mean Dur (sec) 0.0 0.0 0.0 0.0 29.9 29.9 0.0 29.9  Max Dur (sec) 0.0 0.0 0.0 0.0 83.0 83.0 0.0 83.0  Total Dur (min) 0.0 0.0 0.0 0.0 8.0 8.0 0.0 8.0  % of TST 0.0 0.0 0.0 0.0 2.0 2.0 0.0 2.0  Index (#/h TST) 0.0 0.0 0.0 0.0 2.4 2.4 0.0 2.4  *Hypopneas scored based on 4% or greater desaturation.  Sleep Stage:         REM NREM TST  AHI 3.9 1.4 2.4  RDI 3.9 1.4 2.4    Sleep (min) TST (%) REM (min) NREM (min) CA (#) OA (#) MA (#) HYP (#) AHI (#/h) RERA (#) RDI (#/h) Desat (#)  Supine 404.5 100.00 154.0 250.5 0 0 0 16 2.4 0 2.4 30  Non-Supine 0.00 0.00 0.00 0.00 0.00 0.00 0.00 0.00 0.00 0 0.00 0.00     Snoring: Total number of snoring episodes  0  Total time with snoring    min (   % of sleep)   Oximetry Distribution:             WK REM NREM TOTAL  Average (%)   96 96 95 95  < 90% 0.0 0.0 0.0 0.0  < 80% 0.0 0.0 0.0 0.0  < 70% 0.0 0.0 0.0 0.0  # of Desaturations* 0 20 10 30   Desat Index (#/hour) 0.0 7.8 2.4 4.4  Desat Max (%) 0 8 6 8   Desat Max Dur (sec) 0.0 111.0 71.0 111.0  Approx Min O2 during sleep 88  Approx min O2 during a respiratory event 88  Was Oxygen added (Y/N) and  final rate No:   0 LPM  *Desaturations based on 3% or greater drop from baseline.   Cheyne Stokes Breathing: None Present    Heart Rate Summary:  Average Heart Rate During Sleep 85.5 bpm      Highest Heart Rate During Sleep (95th %) 92.0 bpm      Highest Heart Rate During Sleep 129 bpm      Highest Heart Rate During Recording (TIB) 149 bpm       Heart Rate Observations: Event Type # Events   Bradycardia 0 Lowest HR Scored: N/A  Sinus  Tachycardia During Sleep 0 Highest HR Scored: N/A  Narrow Complex Tachycardia 0 Highest HR Scored: N/A  Wide Complex Tachycardia 0 Highest HR Scored: N/A  Asystole 0 Longest Pause: N/A  Atrial Fibrillation 0 Duration Longest Event: N/A  Other Arrythmias  No Type:   Periodic Limb Movement Data: (Primary legs unless otherwise noted) Total # Limb Movement 114 Limb Movement Index 16.9  Total # PLMS 68 PLMS Index 10.1  Total # PLMS Arousals 1 PLMS Arousal Index 0.1  Percentage Sleep Time with PLMS 32.16min (8.0 % sleep)  Mean Duration limb movements (secs) 177.5    IPAP Level (cmH2O) EPAP Level (cmH2O) Total Duration (min) Sleep Duration (min) Sleep (%) REM (%) CA  #) OA # MA # HYP #) AHI (#/hr) RERAs # RERAs (#/hr) RDI (#/hr)  7 7 16.3 16.3 100.0 0.0 0 0 0 1 3.7 0 0.0 3.7  8 8  83.5 83.5 100.0 0.0 0 0 0 4 2.9 0 0.0 2.9  9 9  48.5 48.5 100.0 90.5 0 0 0 6 7.4 0 0.0 7.4  10 10  116.5 116.0 99.6 27.9 0 0 0 2 1.0 0 0.0 1.0  11 11 139.5 139.0 99.6 55.2 0 0 0 3 1.3 0 0.0 1.3

## 2021-02-14 ENCOUNTER — Ambulatory Visit: Payer: BC Managed Care – PPO | Admitting: Physician Assistant

## 2021-02-14 ENCOUNTER — Other Ambulatory Visit: Payer: Self-pay

## 2021-02-14 ENCOUNTER — Encounter: Payer: Self-pay | Admitting: Physician Assistant

## 2021-02-14 VITALS — BP 133/73 | HR 85 | Temp 98.1°F | Resp 16 | Ht 62.0 in | Wt 259.6 lb

## 2021-02-14 DIAGNOSIS — Z1231 Encounter for screening mammogram for malignant neoplasm of breast: Secondary | ICD-10-CM | POA: Diagnosis not present

## 2021-02-14 DIAGNOSIS — L720 Epidermal cyst: Secondary | ICD-10-CM | POA: Diagnosis not present

## 2021-02-14 DIAGNOSIS — G4733 Obstructive sleep apnea (adult) (pediatric): Secondary | ICD-10-CM

## 2021-02-14 NOTE — Progress Notes (Signed)
[facility] [address] [place], [place] [address]  Internal MEDICINE  Office Visit Note  Patient Name: Stacy Chandler  [id number]  [id number]  Date of Service: 02/15/2021  Chief Complaint  Patient presents with   Follow-up    Discuss SS   Depression    HPI Pt is here for routine follow up to review SS.  -PSG revealed an overall AHI of 10.7 apneas and hypopneas per hour of sleep however this increased to an AHI of 45 during REM.  Overall AHI likely reduced due to limited REM sleep observed during study.  Patient did also have some periodic leg movements throughout study.  Patient then went for titration study and was found to do well at his CPAP at 11 cm H2O.  Patient does report mask was not comfortable initially, but was not as sleepy the next day and therefore thinks that CPAP will be beneficial for her. -Patient also mentions that she has a cyst on forehead that has been getting bigger.it is in her hair line.  There are no signs of infection but the increasing size is bothersome to the patient and she would like to have this removed.  Will refer to dermatology for this -Patient is otherwise doing well and is not in need of any refills at this time. -She is due for mammogram and this will be ordered today  Current Medication: Outpatient Encounter Medications as of 02/14/2021  Medication Sig   Cholecalciferol (VITAMIN D3 PO) Take by mouth.   Cyanocobalamin (B-12 PO) Take by mouth.   Ergocalciferol (VITAMIN D2 PO) Take by mouth.   escitalopram (LEXAPRO) 20 MG tablet Take 1 tablet (20 mg total) by mouth daily.   Norethindrone Acetate-Ethinyl Estrad-FE (LOESTRIN 24 FE) 1-20 MG-MCG(24) tablet Take 1 tablet by mouth daily.   venlafaxine (EFFEXOR) 75 MG tablet Take 1 tablet (75 mg total) by mouth daily.   No facility-administered encounter medications on file as of 02/14/2021.    Surgical History: Past Surgical History:  Procedure Laterality Date   CESAREAN SECTION      ELBOW ARTHROPLASTY Left     Medical History: Past Medical History:  Diagnosis Date   Depression     Family History: Family History  Problem Relation Age of Onset   Heart disease Mother    Diabetes Mother    Cancer Mother    Schizophrenia Brother    Cancer Maternal Grandmother    Cancer Paternal Grandmother     Social History   Socioeconomic History   Marital status: Married    Spouse name: Not on file   Number of children: Not on file   Years of education: Not on file   Highest education level: Not on file  Occupational History   Not on file  Tobacco Use   Smoking status: Never   Smokeless tobacco: Never  Substance and Sexual Activity   Alcohol use: Yes   Drug use: No   Sexual activity: Not on file  Other Topics Concern   Not on file  Social History Narrative   Not on file   Social Determinants of Health   Financial Resource Strain: Not on file  Food Insecurity: Not on file  Transportation Needs: Not on file  Physical Activity: Not on file  Stress: Not on file  Social Connections: Not on file  Intimate Partner Violence: Not on file      Review of Systems  Constitutional:  Positive for fatigue. Negative for chills and unexpected weight change.  HENT:  Negative for congestion, postnasal drip, rhinorrhea, sneezing and sore throat.   Eyes:  Negative for redness.  Respiratory:  Negative for cough, chest tightness and shortness of breath.   Cardiovascular:  Negative for chest pain and palpitations.  Gastrointestinal:  Negative for abdominal pain, constipation, diarrhea, nausea and vomiting.  Genitourinary:  Negative for dysuria and frequency.  Musculoskeletal:  Negative for arthralgias, back pain, joint swelling and neck pain.  Skin:  Negative for rash.       Cyst on scalp  Neurological: Negative.  Negative for tremors and numbness.  Hematological:  Negative for adenopathy. Does not bruise/bleed easily.  Psychiatric/Behavioral:  Positive for behavioral  problems (Depression) and sleep disturbance. Negative for suicidal ideas. The patient is nervous/anxious.    Vital Signs: BP 133/73   Pulse 85   Temp 98.1 F (36.7 C)   Resp 16   Ht 5\' 2"  (1.575 m)   Wt 259 lb 9.6 oz (117.8 kg)   SpO2 98%   BMI 47.48 kg/m    Physical Exam Vitals and nursing note reviewed.  Constitutional:      General: She is not in acute distress.    Appearance: She is well-developed. She is obese. She is not diaphoretic.  HENT:     Head: Normocephalic and atraumatic.     Mouth/Throat:     Pharynx: No oropharyngeal exudate.  Eyes:     Pupils: Pupils are equal, round, and reactive to light.  Neck:     Thyroid: No thyromegaly.     Vascular: No JVD.     Trachea: No tracheal deviation.  Cardiovascular:     Rate and Rhythm: Normal rate and regular rhythm.     Heart sounds: Normal heart sounds. No murmur heard.   No friction rub. No gallop.  Pulmonary:     Effort: Pulmonary effort is normal. No respiratory distress.     Breath sounds: No wheezing or rales.  Chest:     Chest wall: No tenderness.  Abdominal:     General: Bowel sounds are normal.     Palpations: Abdomen is soft.  Musculoskeletal:        General: Normal range of motion.     Cervical back: Normal range of motion and neck supple.  Lymphadenopathy:     Cervical: No cervical adenopathy.  Skin:    General: Skin is warm and dry.  Neurological:     Mental Status: She is alert and oriented to person, place, and time.     Cranial Nerves: No cranial nerve deficit.  Psychiatric:        Behavior: Behavior normal.        Thought Content: Thought content normal.        Judgment: Judgment normal.       Assessment/Plan: 1. OSA (obstructive sleep apnea) Will order CPAP at 11cm H2O - For home use only DME continuous positive airway pressure (CPAP)  2. Epidermal cyst Will refer to dermatology - Ambulatory referral to Dermatology  3. Visit for screening mammogram - MM DIGITAL SCREENING  BILATERAL; Future   General Counseling: kamira mellette understanding of the findings of todays visit and agrees with plan of treatment. I have discussed any further diagnostic evaluation that may be needed or ordered today. We also reviewed her medications today. she has been encouraged to call the office with any questions or concerns that should arise related to todays visit.    Orders Placed This Encounter  Procedures   For home use only  DME continuous positive airway pressure (CPAP)   MM DIGITAL SCREENING BILATERAL   Ambulatory referral to Dermatology    No orders of the defined types were placed in this encounter.   This patient was seen by Lynn Ito, PA-C in collaboration with Dr. Beverely Risen as a part of collaborative care agreement.   Total time spent:30 Minutes Time spent includes review of chart, medications, test results, and follow up plan with the patient.      Dr Lyndon Code Internal medicine

## 2021-02-15 ENCOUNTER — Telehealth: Payer: Self-pay

## 2021-02-15 NOTE — Patient Instructions (Signed)
Living With Sleep Apnea Sleep apnea is a condition in which breathing pauses or becomes shallow during sleep. Sleep apnea is most commonly caused by a collapsed or blocked airway. People with sleep apnea usually snore loudly. They may have times when they gasp and stop breathing for 10 seconds or more during sleep. This may happenmany times during the night. The breaks in breathing also interrupt the deep sleep that you need to feel rested. Even if you do not completely wake up from the gaps in breathing, your sleep may not be restful and you feel tired during the day. You may also have a headache in the morning and low energy during the day, and you may feel anxiousor depressed. How can sleep apnea affect me? Sleep apnea increases your chances of extreme tiredness during the day (daytime fatigue). It can also increase your risk for health conditions, such as: Heart attack. Stroke. Obesity. Type 2 diabetes. Heart failure. Irregular heartbeat. High blood pressure. If you have daytime fatigue as a result of sleep apnea, you may be more likely to: Perform poorly at school or work. Fall asleep while driving. Have difficulty with attention. Develop depression or anxiety. Have sexual dysfunction. What actions can I take to manage sleep apnea? Sleep apnea treatment  If you were given a device to open your airway while you sleep, use it only as told by your health care provider. You may be given: An oral appliance. This is a custom-made mouthpiece that shifts your lower jaw forward. A continuous positive airway pressure (CPAP) device. This device blows air through a mask when you breathe out (exhale). A nasal expiratory positive airway pressure (EPAP) device. This device has valves that you put into each nostril. A bi-level positive airway pressure (BPAP) device. This device blows air through a mask when you breathe in (inhale) and breathe out (exhale). You may need surgery if other treatments do  not work for you.  Sleep habits Go to sleep and wake up at the same time every day. This helps set your internal clock (circadian rhythm) for sleeping. If you stay up later than usual, such as on weekends, try to get up in the morning within 2 hours of your normal wake time. Try to get at least 7-9 hours of sleep each night. Stop using a computer, tablet, and mobile phone a few hours before bedtime. Do not take long naps during the day. If you nap, limit it to 30 minutes. Have a relaxing bedtime routine. Reading or listening to music may relax you and help you sleep. Use your bedroom only for sleep. Keep your television and computer out of your bedroom. Keep your bedroom cool, dark, and quiet. Use a supportive mattress and pillows. Follow your health care provider's instructions for other changes to sleep habits. Nutrition Do not eat heavy meals in the evening. Do not have caffeine in the later part of the day. The effects of caffeine can last for more than 5 hours. Follow your health care provider's or dietitian's instructions for any diet changes. Lifestyle     Do not drink alcohol before bedtime. Alcohol can cause you to fall asleep at first, but then it can cause you to wake up in the middle of the night and have trouble getting back to sleep. Do not use any products that contain nicotine or tobacco. These products include cigarettes, chewing tobacco, and vaping devices, such as e-cigarettes. If you need help quitting, ask your health care provider. Medicines Take over-the-counter   and prescription medicines only as told by your health care provider. Do not use over-the-counter sleep medicine. You can become dependent on this medicine, and it can make sleep apnea worse. Do not use medicines, such as sedatives and narcotics, unless told by your health care provider. Activity Exercise on most days, but avoid exercising in the evening. Exercising near bedtime can interfere with  sleeping. If possible, spend time outside every day. Natural light helps regulate your circadian rhythm. General information Lose weight if you need to, and maintain a healthy weight. Keep all follow-up visits. This is important. If you are having surgery, make sure to tell your health care provider that you have sleep apnea. You may need to bring your device with you. Where to find more information Learn more about sleep apnea and daytime fatigue from: American Sleep Association: sleepassociation.org National Sleep Foundation: sleepfoundation.org National Heart, Lung, and Blood Institute: nhlbi.nih.gov Summary Sleep apnea is a condition in which breathing pauses or becomes shallow during sleep. Sleep apnea can cause daytime fatigue and other serious health conditions. You may need to wear a device while sleeping to help keep your airway open. If you are having surgery, make sure to tell your health care provider that you have sleep apnea. You may need to bring your device with you. Making changes to sleep habits, diet, lifestyle, and activity can help you manage sleep apnea. This information is not intended to replace advice given to you by your health care provider. Make sure you discuss any questions you have with your healthcare provider. Document Revised: 03/23/2020 Document Reviewed: 03/23/2020 Elsevier Patient Education  2022 Elsevier Inc.  

## 2021-02-15 NOTE — Telephone Encounter (Signed)
Dermatology referral manually faxed to [facility] [phone]

## 2021-02-15 NOTE — Telephone Encounter (Signed)
Sent CPAP order to [facility]. Waiting for processing. They will process and contact patient.

## 2021-02-19 ENCOUNTER — Telehealth: Payer: Self-pay

## 2021-02-19 NOTE — Telephone Encounter (Signed)
Order for Cpap machine has been sent out to [facility] via community messages.

## 2021-02-28 ENCOUNTER — Encounter: Payer: Self-pay | Admitting: Physician Assistant

## 2021-03-05 NOTE — Telephone Encounter (Signed)
Appointment for surgery 05/02/21 @ 3:00-Toni

## 2021-03-27 ENCOUNTER — Telehealth: Payer: Self-pay

## 2021-03-27 NOTE — Telephone Encounter (Signed)
Contacted patient to advice that [facility] has their order for cpap equipment however they have no estimated time frame on when they they will get a machine in. I contacted the patient and left a vm with this information. Left a call back number for [facility] of [phone].

## 2021-04-08 ENCOUNTER — Telehealth: Payer: Self-pay

## 2021-04-08 NOTE — Telephone Encounter (Signed)
Verbal order for cpap supplies signed by provider and placed in [facility] folder.

## 2021-04-16 ENCOUNTER — Other Ambulatory Visit: Payer: Self-pay | Admitting: Physician Assistant

## 2021-04-16 DIAGNOSIS — Z3041 Encounter for surveillance of contraceptive pills: Secondary | ICD-10-CM

## 2021-05-16 ENCOUNTER — Other Ambulatory Visit: Payer: Self-pay

## 2021-05-16 ENCOUNTER — Ambulatory Visit (INDEPENDENT_AMBULATORY_CARE_PROVIDER_SITE_OTHER): Payer: BC Managed Care – PPO | Admitting: Physician Assistant

## 2021-05-16 ENCOUNTER — Encounter: Payer: Self-pay | Admitting: Physician Assistant

## 2021-05-16 VITALS — BP 148/86 | HR 93 | Temp 98.0°F | Resp 16 | Ht 62.0 in | Wt 259.2 lb

## 2021-05-16 DIAGNOSIS — G4733 Obstructive sleep apnea (adult) (pediatric): Secondary | ICD-10-CM

## 2021-05-16 DIAGNOSIS — F411 Generalized anxiety disorder: Secondary | ICD-10-CM | POA: Diagnosis not present

## 2021-05-16 DIAGNOSIS — F331 Major depressive disorder, recurrent, moderate: Secondary | ICD-10-CM

## 2021-05-16 DIAGNOSIS — E559 Vitamin D deficiency, unspecified: Secondary | ICD-10-CM

## 2021-05-16 DIAGNOSIS — R5383 Other fatigue: Secondary | ICD-10-CM

## 2021-05-16 DIAGNOSIS — E782 Mixed hyperlipidemia: Secondary | ICD-10-CM | POA: Diagnosis not present

## 2021-05-16 DIAGNOSIS — E538 Deficiency of other specified B group vitamins: Secondary | ICD-10-CM

## 2021-05-16 DIAGNOSIS — R7301 Impaired fasting glucose: Secondary | ICD-10-CM

## 2021-05-16 NOTE — Progress Notes (Signed)
[facility] [address] [place], [place] [address]  Internal MEDICINE  Office Visit Note  Patient Name: Stacy Chandler  [id number]  [id number]  Date of Service: 05/24/2021  Chief Complaint  Patient presents with   Follow-up   Depression   Quality Metric Gaps    Pap Smear and Colonoscopy    HPI Pt is here for routine follow up -Has been having more anxiety lately. Conflict with coworkers is contributing to this. May try increasing Lexapro. Patient would also like to establish with psych and try counseling -working on cpap, cannot afford at this time, but is working to save up for it as she knows she needs it felt much better after one night of use in the lab -Cyst successfully removed by dermatology and well healed -Dentist next month -Due for routine fasting labs prior to CPE  Current Medication: Outpatient Encounter Medications as of 05/16/2021  Medication Sig   escitalopram (LEXAPRO) 20 MG tablet Take 1 tablet (20 mg total) by mouth daily.   LARIN 24 FE 1-20 MG-MCG(24) tablet Take 1 tablet by mouth once daily   venlafaxine (EFFEXOR) 75 MG tablet Take 1 tablet (75 mg total) by mouth daily.   Cholecalciferol (VITAMIN D3 PO) Take by mouth.   Cyanocobalamin (B-12 PO) Take by mouth.   Ergocalciferol (VITAMIN D2 PO) Take by mouth.   No facility-administered encounter medications on file as of 05/16/2021.    Surgical History: Past Surgical History:  Procedure Laterality Date   CESAREAN SECTION     ELBOW ARTHROPLASTY Left     Medical History: Past Medical History:  Diagnosis Date   Depression     Family History: Family History  Problem Relation Age of Onset   Heart disease Mother    Diabetes Mother    Cancer Mother    Schizophrenia Brother    Cancer Maternal Grandmother    Cancer Paternal Grandmother     Social History   Socioeconomic History   Marital status: Married    Spouse name: Not on file   Number of children: Not on file   Years of  education: Not on file   Highest education level: Not on file  Occupational History   Not on file  Tobacco Use   Smoking status: Never   Smokeless tobacco: Never  Substance and Sexual Activity   Alcohol use: Yes   Drug use: No   Sexual activity: Not on file  Other Topics Concern   Not on file  Social History Narrative   Not on file   Social Determinants of Health   Financial Resource Strain: Not on file  Food Insecurity: Not on file  Transportation Needs: Not on file  Physical Activity: Not on file  Stress: Not on file  Social Connections: Not on file  Intimate Partner Violence: Not on file      Review of Systems  Constitutional:  Positive for fatigue. Negative for chills and unexpected weight change.  HENT:  Negative for congestion, postnasal drip, rhinorrhea, sneezing and sore throat.   Eyes:  Negative for redness.  Respiratory:  Negative for cough, chest tightness and shortness of breath.   Cardiovascular:  Negative for chest pain and palpitations.  Gastrointestinal:  Negative for abdominal pain, constipation, diarrhea, nausea and vomiting.  Genitourinary:  Negative for dysuria and frequency.  Musculoskeletal:  Negative for arthralgias, back pain, joint swelling and neck pain.  Skin:  Negative for rash.       Cyst on scalp  Neurological:  Negative.  Negative for tremors and numbness.  Hematological:  Negative for adenopathy. Does not bruise/bleed easily.  Psychiatric/Behavioral:  Positive for behavioral problems (Depression) and sleep disturbance. Negative for suicidal ideas. The patient is nervous/anxious.    Vital Signs: BP (!) 148/86    Pulse 93    Temp 98 F (36.7 C)    Resp 16    Ht 5\' 2"  (1.575 m)    Wt 259 lb 3.2 oz (117.6 kg)    SpO2 97%    BMI 47.41 kg/m    Physical Exam Vitals and nursing note reviewed.  Constitutional:      General: She is not in acute distress.    Appearance: She is well-developed. She is obese. She is not diaphoretic.  HENT:      Head: Normocephalic and atraumatic.     Mouth/Throat:     Pharynx: No oropharyngeal exudate.  Eyes:     Pupils: Pupils are equal, round, and reactive to light.  Neck:     Thyroid: No thyromegaly.     Vascular: No JVD.     Trachea: No tracheal deviation.  Cardiovascular:     Rate and Rhythm: Normal rate and regular rhythm.     Heart sounds: Normal heart sounds. No murmur heard.   No friction rub. No gallop.  Pulmonary:     Effort: Pulmonary effort is normal. No respiratory distress.     Breath sounds: No wheezing or rales.  Chest:     Chest wall: No tenderness.  Abdominal:     General: Bowel sounds are normal.     Palpations: Abdomen is soft.  Musculoskeletal:        General: Normal range of motion.     Cervical back: Normal range of motion and neck supple.  Lymphadenopathy:     Cervical: No cervical adenopathy.  Skin:    General: Skin is warm and dry.  Neurological:     Mental Status: She is alert and oriented to person, place, and time.     Cranial Nerves: No cranial nerve deficit.  Psychiatric:        Thought Content: Thought content normal.        Judgment: Judgment normal.     Comments: anxious in office       Assessment/Plan: 1. GAD (generalized anxiety disorder) Will continue effexor and trial increasing lexapro. Will also refer to psych - Ambulatory referral to Psychiatry  2. Moderate episode of recurrent major depressive disorder (HCC) Will continue effexor and trial increasing lexapro. Will also refer to psych - Ambulatory referral to Psychiatry  3. OSA (obstructive sleep apnea) Working on saving for cpap machine  4. Mixed hyperlipidemia - Lipid Panel With LDL/HDL Ratio  5. Vitamin D deficiency - VITAMIN D 25 Hydroxy (Vit-D Deficiency, Fractures)  6. B12 deficiency - B12 and Folate Panel  7. Impaired fasting glucose - Hgb A1C w/o eAG  8. Other fatigue - CBC w/Diff/Platelet - Comprehensive metabolic panel - Lipid Panel With LDL/HDL Ratio -  TSH + free T4 - VITAMIN D 25 Hydroxy (Vit-D Deficiency, Fractures) - B12 and Folate Panel - Hgb A1C w/o eAG   General Counseling: Rainee verbalizes understanding of the findings of todays visit and agrees with plan of treatment. I have discussed any further diagnostic evaluation that may be needed or ordered today. We also reviewed her medications today. she has been encouraged to call the office with any questions or concerns that should arise related to todays visit.    Orders  Placed This Encounter  Procedures   CBC w/Diff/Platelet   Comprehensive metabolic panel   Lipid Panel With LDL/HDL Ratio   TSH + free T4   VITAMIN D 25 Hydroxy (Vit-D Deficiency, Fractures)   B12 and Folate Panel   Hgb A1C w/o eAG   Ambulatory referral to Psychiatry    No orders of the defined types were placed in this encounter.   This patient was seen by Lynn Ito, PA-C in collaboration with Dr. Beverely Risen as a part of collaborative care agreement.   Total time spent:30 Minutes Time spent includes review of chart, medications, test results, and follow up plan with the patient.      Dr Lyndon Code Internal medicine

## 2021-05-27 ENCOUNTER — Encounter: Payer: Self-pay | Admitting: Physician Assistant

## 2021-06-14 ENCOUNTER — Other Ambulatory Visit: Payer: Self-pay | Admitting: Physician Assistant

## 2021-06-14 DIAGNOSIS — F411 Generalized anxiety disorder: Secondary | ICD-10-CM

## 2021-06-14 DIAGNOSIS — F331 Major depressive disorder, recurrent, moderate: Secondary | ICD-10-CM

## 2021-06-16 ENCOUNTER — Other Ambulatory Visit: Payer: Self-pay | Admitting: Physician Assistant

## 2021-06-16 DIAGNOSIS — F331 Major depressive disorder, recurrent, moderate: Secondary | ICD-10-CM

## 2021-06-16 DIAGNOSIS — F411 Generalized anxiety disorder: Secondary | ICD-10-CM

## 2021-06-16 MED ORDER — ESCITALOPRAM OXALATE 20 MG PO TABS: 20.0000 mg | ORAL_TABLET | Freq: Every day | ORAL | 0 refills | Status: DC

## 2021-06-17 ENCOUNTER — Other Ambulatory Visit: Payer: Self-pay

## 2021-06-17 DIAGNOSIS — F411 Generalized anxiety disorder: Secondary | ICD-10-CM

## 2021-06-17 DIAGNOSIS — F331 Major depressive disorder, recurrent, moderate: Secondary | ICD-10-CM

## 2021-06-17 MED ORDER — ESCITALOPRAM OXALATE 20 MG PO TABS: ORAL_TABLET | ORAL | 0 refills | Status: DC

## 2021-07-05 LAB — COMPREHENSIVE METABOLIC PANEL
ALT: 9 IU/L (ref 0–32)
AST: 12 IU/L (ref 0–40)
Albumin/Globulin Ratio: 1.7 (ref 1.2–2.2)
Albumin: 4.3 g/dL (ref 3.8–4.8)
Alkaline Phosphatase: 86 IU/L (ref 44–121)
BUN/Creatinine Ratio: 13 (ref 9–23)
BUN: 9 mg/dL (ref 6–24)
Bilirubin Total: 0.2 mg/dL (ref 0.0–1.2)
CO2: 23 mmol/L (ref 20–29)
Calcium: 9.9 mg/dL (ref 8.7–10.2)
Chloride: 99 mmol/L (ref 96–106)
Creatinine, Ser: 0.69 mg/dL (ref 0.57–1.00)
Globulin, Total: 2.5 g/dL (ref 1.5–4.5)
Glucose: 108 mg/dL — ABNORMAL HIGH (ref 70–99)
Potassium: 4.5 mmol/L (ref 3.5–5.2)
Sodium: 137 mmol/L (ref 134–144)
Total Protein: 6.8 g/dL (ref 6.0–8.5)
eGFR: 108 mL/min/{1.73_m2} (ref >59)

## 2021-07-05 LAB — CBC WITH DIFFERENTIAL/PLATELET
Basophils Absolute: 0.1 10*3/uL (ref 0.0–0.2)
Basos: 1 %
EOS (ABSOLUTE): 0.2 10*3/uL (ref 0.0–0.4)
Eos: 2 %
Hematocrit: 39.9 % (ref 34.0–46.6)
Hemoglobin: 13.3 g/dL (ref 11.1–15.9)
Immature Grans (Abs): 0.1 10*3/uL (ref 0.0–0.1)
Immature Granulocytes: 1 %
Lymphocytes Absolute: 3.6 10*3/uL — ABNORMAL HIGH (ref 0.7–3.1)
Lymphs: 28 %
MCH: 28.4 pg (ref 26.6–33.0)
MCHC: 33.3 g/dL (ref 31.5–35.7)
MCV: 85 fL (ref 79–97)
Monocytes Absolute: 0.5 10*3/uL (ref 0.1–0.9)
Monocytes: 4 %
Neutrophils Absolute: 8.5 10*3/uL — ABNORMAL HIGH (ref 1.4–7.0)
Neutrophils: 64 %
Platelets: 466 10*3/uL — ABNORMAL HIGH (ref 150–450)
RBC: 4.68 x10E6/uL (ref 3.77–5.28)
RDW: 13.7 % (ref 11.7–15.4)
WBC: 13 10*3/uL — ABNORMAL HIGH (ref 3.4–10.8)

## 2021-07-05 LAB — LIPID PANEL WITH LDL/HDL RATIO
Cholesterol, Total: 204 mg/dL — ABNORMAL HIGH (ref 100–199)
HDL: 41 mg/dL (ref >39)
LDL Chol Calc (NIH): 121 mg/dL — ABNORMAL HIGH (ref 0–99)
LDL/HDL Ratio: 3 ratio (ref 0.0–3.2)
Triglycerides: 240 mg/dL — ABNORMAL HIGH (ref 0–149)
VLDL Cholesterol Cal: 42 mg/dL — ABNORMAL HIGH (ref 5–40)

## 2021-07-05 LAB — TSH+FREE T4
Free T4: 0.68 ng/dL — ABNORMAL LOW (ref 0.82–1.77)
TSH: 3.07 u[IU]/mL (ref 0.450–4.500)

## 2021-07-05 LAB — B12 AND FOLATE PANEL
Folate: 5.3 ng/mL (ref >3.0)
Vitamin B-12: 715 pg/mL (ref 232–1245)

## 2021-07-05 LAB — VITAMIN D 25 HYDROXY (VIT D DEFICIENCY, FRACTURES): Vit D, 25-Hydroxy: 35.3 ng/mL (ref 30.0–100.0)

## 2021-07-05 LAB — HGB A1C W/O EAG: Hgb A1c MFr Bld: 6.1 % — ABNORMAL HIGH (ref 4.8–5.6)

## 2021-07-08 ENCOUNTER — Other Ambulatory Visit: Payer: Self-pay | Admitting: Physician Assistant

## 2021-07-08 DIAGNOSIS — E039 Hypothyroidism, unspecified: Secondary | ICD-10-CM

## 2021-07-08 MED ORDER — LEVOTHYROXINE SODIUM 25 MCG PO TABS: 25.0000 ug | ORAL_TABLET | Freq: Every day | ORAL | 3 refills | Status: AC

## 2021-07-10 ENCOUNTER — Telehealth: Payer: Self-pay

## 2021-07-10 NOTE — Telephone Encounter (Signed)
Spoke with patient regarding lab results on 07/10/2021. ?

## 2021-07-10 NOTE — Telephone Encounter (Signed)
-----   Message from Carlean Jews, PA-C sent at 07/08/2021  1:19 PM EDT ----- ?Please let her know that her WBC and platelet counts were elevated and I will want to monitor these, also her cholesterol is elevated and should avoid fried/fatty foods and add fish oil supplement, her A1c was also done and was 6.1 which does put her in prediabetic range so we will continue to monitor this as well. Her thyroid levels were also low and I would like to start her on low dose thyroid hormone replacement--she will take this first thing in the morning before eating. Low thyroid can contribute to anx/dep, fatigue, and weight gain. We can discuss more at her next visit. ?

## 2021-07-14 ENCOUNTER — Other Ambulatory Visit: Payer: Self-pay | Admitting: Physician Assistant

## 2021-07-14 ENCOUNTER — Other Ambulatory Visit: Payer: Self-pay | Admitting: Internal Medicine

## 2021-07-14 DIAGNOSIS — Z3041 Encounter for surveillance of contraceptive pills: Secondary | ICD-10-CM

## 2021-07-14 DIAGNOSIS — F331 Major depressive disorder, recurrent, moderate: Secondary | ICD-10-CM

## 2021-07-14 DIAGNOSIS — F411 Generalized anxiety disorder: Secondary | ICD-10-CM

## 2021-07-29 ENCOUNTER — Other Ambulatory Visit: Payer: Self-pay | Admitting: Physician Assistant

## 2021-07-29 DIAGNOSIS — F331 Major depressive disorder, recurrent, moderate: Secondary | ICD-10-CM

## 2021-07-29 DIAGNOSIS — F411 Generalized anxiety disorder: Secondary | ICD-10-CM

## 2021-07-29 MED ORDER — ESCITALOPRAM OXALATE 20 MG PO TABS: ORAL_TABLET | ORAL | 0 refills | Status: AC

## 2021-08-15 ENCOUNTER — Encounter: Payer: Self-pay | Admitting: Physician Assistant

## 2021-08-15 ENCOUNTER — Telehealth: Payer: Self-pay

## 2021-08-15 ENCOUNTER — Ambulatory Visit (INDEPENDENT_AMBULATORY_CARE_PROVIDER_SITE_OTHER): Payer: BC Managed Care – PPO | Admitting: Physician Assistant

## 2021-08-15 VITALS — BP 115/80 | HR 84 | Temp 98.1°F | Resp 16 | Ht 62.0 in | Wt 256.2 lb

## 2021-08-15 DIAGNOSIS — R7989 Other specified abnormal findings of blood chemistry: Secondary | ICD-10-CM

## 2021-08-15 DIAGNOSIS — F411 Generalized anxiety disorder: Secondary | ICD-10-CM

## 2021-08-15 DIAGNOSIS — Z01419 Encounter for gynecological examination (general) (routine) without abnormal findings: Secondary | ICD-10-CM

## 2021-08-15 DIAGNOSIS — E039 Hypothyroidism, unspecified: Secondary | ICD-10-CM | POA: Diagnosis not present

## 2021-08-15 DIAGNOSIS — R7303 Prediabetes: Secondary | ICD-10-CM | POA: Diagnosis not present

## 2021-08-15 DIAGNOSIS — Z113 Encounter for screening for infections with a predominantly sexual mode of transmission: Secondary | ICD-10-CM

## 2021-08-15 DIAGNOSIS — R300 Dysuria: Secondary | ICD-10-CM

## 2021-08-15 DIAGNOSIS — E782 Mixed hyperlipidemia: Secondary | ICD-10-CM

## 2021-08-15 DIAGNOSIS — Z0001 Encounter for general adult medical examination with abnormal findings: Secondary | ICD-10-CM

## 2021-08-15 DIAGNOSIS — F331 Major depressive disorder, recurrent, moderate: Secondary | ICD-10-CM

## 2021-08-15 DIAGNOSIS — Z124 Encounter for screening for malignant neoplasm of cervix: Secondary | ICD-10-CM

## 2021-08-15 MED ORDER — ROSUVASTATIN CALCIUM 5 MG PO TABS: 5.0000 mg | ORAL_TABLET | Freq: Every day | ORAL | 3 refills | Status: AC

## 2021-08-15 NOTE — Progress Notes (Signed)
?[facility] ?[address] ?[place], [place] [address] ? ?Internal MEDICINE  ?Office Visit Note ? ?Patient Name: Stacy Chandler ? [id number]  ?[id number] ? ?Date of Service: 08/21/2021 ? ?Chief Complaint  ?Patient presents with  ? Annual Exam  ? ? ? ?HPI ?Pt is here for routine health maintenance examination ?-Labs were reviewed over the phone and showed an elevated A1c that puts her in prediabetic range, an elevated WBC and platelet  count, elevated cholesterol, and low free T4. She was started on thyroid medication and is tolerating this well.  She is also working on diet and exercise to help cholesterol and prediabetes findings. Discussed starting low dose statin which she is agreeable to. 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 ?Current Medication: ?Outpatient Encounter Medications as of 08/15/2021  ?Medication Sig  ? escitalopram (LEXAPRO) 20 MG tablet Take 2 tab po daily  ? LARIN 24 FE 1-20 MG-MCG(24) tablet Take 1 tablet by mouth once daily  ? levothyroxine (SYNTHROID) 25 MCG tablet Take 1 tablet (25 mcg total) by mouth daily.  ? rosuvastatin (CRESTOR) 5 MG tablet Take 1 tablet (5 mg total) by mouth daily.  ? venlafaxine (EFFEXOR) 75 MG tablet Take 1 tablet by mouth once daily  ? ?No facility-administered encounter medications on file as of 08/15/2021.  ? ? ?Surgical History: ?Past Surgical History:  ?Procedure Laterality Date  ? CESAREAN SECTION    ? ELBOW ARTHROPLASTY Left   ? ? ?Medical History: ?Past Medical History:  ?Diagnosis Date  ? Depression   ? ? ?Family History: ?Family History  ?Problem Relation Age of Onset  ? Heart disease Mother   ? Diabetes Mother   ? Cancer Mother    ? Schizophrenia Brother   ? Cancer Maternal Grandmother   ? Cancer Paternal Grandmother   ? ? ? ? ?Review of Systems  ?Constitutional:  Positive for fatigue. Negative for chills and unexpected weight change.  ?HENT:  Negative for congestion, postnasal drip, rhinorrhea, sneezing and sore throat.   ?Eyes:  Negative for redness.  ?Respiratory:  Negative for cough, chest tightness and shortness of breath.   ?Cardiovascular:  Negative for chest pain and palpitations.  ?Gastrointestinal:  Negative for abdominal pain, constipation, diarrhea, nausea and vomiting.  ?Genitourinary:  Negative for dysuria and frequency.  ?Musculoskeletal:  Negative for arthralgias, back pain, joint swelling and neck pain.  ?Skin:  Negative for rash.  ?Neurological: Negative.  Negative for tremors and numbness.  ?Hematological:  Negative for adenopathy. Does not bruise/bleed easily.  ?Psychiatric/Behavioral:  Positive for behavioral problems (Depression) and sleep disturbance. Negative for suicidal ideas. The patient is nervous/anxious.   ? ? ?Vital Signs: ?BP 115/80   Pulse 84   Temp 98.1 ?F (36.7 ?C)   Resp 16   Ht $R'5\' 2"'dp$  (1.575 m)   Wt 256 lb 3.2 oz (116.2 kg)   SpO2 98%   BMI 46.86 kg/m?  ? ? ?Physical Exam ?Vitals and nursing note reviewed.  ?Constitutional:   ?   General: She is not in acute distress. ?   Appearance: She is well-developed. She is obese. She is not diaphoretic.  ?HENT:  ?   Head: Normocephalic and  atraumatic.  ?   Mouth/Throat:  ?   Pharynx: No oropharyngeal exudate.  ?Eyes:  ?   Pupils: Pupils are equal, round, and reactive to light.  ?Neck:  ?   Thyroid: No thyromegaly.  ?   Vascular: No JVD.  ?   Trachea: No tracheal deviation.  ?Cardiovascular:  ?   Rate and Rhythm: Normal rate and regular rhythm.  ?   Heart sounds: Normal heart sounds. No murmur heard. ?  No friction rub. No gallop.  ?Pulmonary:  ?   Effort: Pulmonary effort is normal. No respiratory distress.  ?   Breath sounds: No wheezing or rales.  ?Chest:   ?   Chest wall: No tenderness.  ?Breasts: ?   Right: Normal. No mass.  ?   Left: Normal. No mass.  ?Abdominal:  ?   General: Bowel sounds are normal.  ?   Palpations: Abdomen is soft.  ?   Tenderness: There is no abdominal tenderness.  ?Genitourinary: ?   Vagina: Vaginal discharge present.  ?   Comments: Pap performed ?Musculoskeletal:     ?   General: Normal range of motion.  ?   Cervical back: Normal range of motion and neck supple.  ?Lymphadenopathy:  ?   Cervical: No cervical adenopathy.  ?Skin: ?   General: Skin is warm and dry.  ?Neurological:  ?   Mental Status: She is alert and oriented to person, place, and time.  ?   Cranial Nerves: No cranial nerve deficit.  ?Psychiatric:     ?   Thought Content: Thought content normal.     ?   Judgment: Judgment normal.  ?   Comments: anxious in office  ? ? ? ?LABS: ?Recent Results (from the past 2160 hour(s))  ?CBC w/Diff/Platelet     Status: Abnormal  ? Collection Time: 07/04/21  8:46 AM  ?Result Value Ref Range  ? WBC 13.0 (H) 3.4 - 10.8 x10E3/uL  ? RBC 4.68 3.77 - 5.28 x10E6/uL  ? Hemoglobin 13.3 11.1 - 15.9 g/dL  ? Hematocrit 39.9 34.0 - 46.6 %  ? MCV 85 79 - 97 fL  ? MCH 28.4 26.6 - 33.0 pg  ? MCHC 33.3 31.5 - 35.7 g/dL  ? RDW 13.7 11.7 - 15.4 %  ? Platelets 466 (H) 150 - 450 x10E3/uL  ? Neutrophils 64 Not Estab. %  ? Lymphs 28 Not Estab. %  ? Monocytes 4 Not Estab. %  ? Eos 2 Not Estab. %  ? Basos 1 Not Estab. %  ? Neutrophils Absolute 8.5 (H) 1.4 - 7.0 x10E3/uL  ? Lymphocytes Absolute 3.6 (H) 0.7 - 3.1 x10E3/uL  ? Monocytes Absolute 0.5 0.1 - 0.9 x10E3/uL  ? EOS (ABSOLUTE) 0.2 0.0 - 0.4 x10E3/uL  ? Basophils Absolute 0.1 0.0 - 0.2 x10E3/uL  ? Immature Granulocytes 1 Not Estab. %  ? Immature Grans (Abs) 0.1 0.0 - 0.1 x10E3/uL  ?Comprehensive metabolic panel     Status: Abnormal  ? Collection Time: 07/04/21  8:46 AM  ?Result Value Ref Range  ? Glucose 108 (H) 70 - 99 mg/dL  ? BUN 9 6 - 24 mg/dL  ? Creatinine, Ser 0.69 0.57 - 1.00 mg/dL  ? eGFR 108 >59 mL/min/1.73   ? BUN/Creatinine Ratio 13 9 - 23  ? Sodium 137 134 - 144 mmol/L  ? Potassium 4.5 3.5 - 5.2 mmol/L  ? Chloride 99 96 - 106 mmol/L  ? CO2 23 20 - 29 mmol/L  ? Calcium 9.9 8.7 -  10.2 mg/dL  ? Total Protein 6.8 6.0 - 8.5 g/dL  ? Albumin 4.3 3.8 - 4.8 g/dL  ? Globulin, Total 2.5 1.5 - 4.5 g/dL  ? Albumin/Globulin Ratio 1.7 1.2 - 2.2  ? Bilirubin Total 0.2 0.0 - 1.2 mg/dL  ? Alkaline Phosphatase 86 44 - 121 IU/L  ? AST 12 0 - 40 IU/L  ? ALT 9 0 - 32 IU/L  ?Lipid Panel With LDL/HDL Ratio     Status: Abnormal  ? Collection Time: 07/04/21  8:46 AM  ?Result Value Ref Range  ? Cholesterol, Total 204 (H) 100 - 199 mg/dL  ? Triglycerides 240 (H) 0 - 149 mg/dL  ? HDL 41 >39 mg/dL  ? VLDL Cholesterol Cal 42 (H) 5 - 40 mg/dL  ? LDL Chol Calc (NIH) 121 (H) 0 - 99 mg/dL  ? LDL/HDL Ratio 3.0 0.0 - 3.2 ratio  ?  Comment:                                     LDL/HDL Ratio ?                                            Men  Women ?                              1/2 Avg.Risk  1.0    1.5 ?                                  Avg.Risk  3.6    3.2 ?                               2X Avg.Risk  6.2    5.0 ?                               3X Avg.Risk  8.0    6.1 ?  ?TSH + free T4     Status: Abnormal  ? Collection Time: 07/04/21  8:46 AM  ?Result Value Ref Range  ? TSH 3.070 0.450 - 4.500 uIU/mL  ? Free T4 0.68 (L) 0.82 - 1.77 ng/dL  ?VITAMIN D 25 Hydroxy (Vit-D Deficiency, Fractures)     Status: None  ? Collection Time: 07/04/21  8:46 AM  ?Result Value Ref Range  ? Vit D, 25-Hydroxy 35.3 30.0 - 100.0 ng/mL  ?  Comment: Vitamin D deficiency has been defined by the Institute of ?Medicine and an Endocrine Society practice guideline as a ?level of serum 25-OH vitamin D less than 20 ng/mL (1,2). ?The Endocrine Society went on to further define vitamin D ?insufficiency as a level between 21 and 29 ng/mL (2). ?1. IOM (Institute of Medicine). 2010. Dietary reference ?   intakes for calcium and D. [place]: The ?   [employer]. ?2. Holick MF,  [place], Bischoff-Ferrari HA, et al. ?   Evaluation, treatment, and prevention of vitamin D ?   deficiency: an Endocrine Society clinical practice ?   guideline. JCEM. 2011 Jul; 96(7):1911-30. ?  ?B12 a

## 2021-08-15 NOTE — Telephone Encounter (Signed)
Per patient request, psy referral sent to local facility, [facility]-Toni ?

## 2021-08-16 LAB — UA/M W/RFLX CULTURE, ROUTINE
Bilirubin, UA: NEGATIVE
Glucose, UA: NEGATIVE
Ketones, UA: NEGATIVE
Leukocytes,UA: NEGATIVE
Nitrite, UA: NEGATIVE
Protein,UA: NEGATIVE
RBC, UA: NEGATIVE
Specific Gravity, UA: 1.006 (ref 1.005–1.030)
Urobilinogen, Ur: 0.2 mg/dL (ref 0.2–1.0)
pH, UA: 6 (ref 5.0–7.5)

## 2021-08-16 LAB — MICROSCOPIC EXAMINATION: Casts: NONE SEEN /lpf

## 2021-08-17 LAB — NUSWAB VAGINITIS PLUS (VG+)
Candida albicans, NAA: NEGATIVE
Candida glabrata, NAA: NEGATIVE
Chlamydia trachomatis, NAA: NEGATIVE
Neisseria gonorrhoeae, NAA: NEGATIVE
Trich vag by NAA: NEGATIVE

## 2021-08-19 ENCOUNTER — Telehealth: Payer: Self-pay

## 2021-08-19 NOTE — Telephone Encounter (Signed)
BH referral redirected to [facility]. [facility] not taking new patients at this time-Toni ?

## 2021-08-20 LAB — IGP, APTIMA HPV: HPV Aptima: NEGATIVE

## 2021-08-21 ENCOUNTER — Other Ambulatory Visit: Payer: Self-pay | Admitting: Physician Assistant

## 2021-08-21 MED ORDER — FLUCONAZOLE 150 MG PO TABS: 150.0000 mg | ORAL_TABLET | Freq: Once | ORAL | 0 refills | Status: AC

## 2021-08-29 ENCOUNTER — Telehealth: Payer: Self-pay

## 2021-08-29 NOTE — Telephone Encounter (Signed)
-----   Message from Carlean Jews, PA-C sent at 08/21/2021  1:17 PM EDT ----- ?Please let her know her pap was normal, she does have some evidence of yeast infection and I will send a tab of diflucan ?

## 2021-08-29 NOTE — Telephone Encounter (Signed)
Spoke to patient regarding pap-smear results and that Diflucan is at the pharmacy. ?

## 2021-09-04 ENCOUNTER — Ambulatory Visit
Admission: RE | Admit: 2021-09-04 | Discharge: 2021-09-04 | Disposition: A | Discharge status: Home / Self Care | Payer: BC Managed Care – PPO | Source: Ambulatory Visit | Attending: Physician Assistant | Admitting: Physician Assistant

## 2021-09-04 DIAGNOSIS — Z1231 Encounter for screening mammogram for malignant neoplasm of breast: Secondary | ICD-10-CM | POA: Insufficient documentation

## 2021-09-05 ENCOUNTER — Other Ambulatory Visit: Payer: Self-pay | Admitting: *Deleted

## 2021-09-05 ENCOUNTER — Inpatient Hospital Stay
Admission: RE | Admit: 2021-09-05 | Discharge: 2021-09-05 | Disposition: A | Discharge status: Home / Self Care | Payer: Self-pay | Source: Ambulatory Visit | Attending: *Deleted | Admitting: *Deleted

## 2021-09-05 DIAGNOSIS — Z1231 Encounter for screening mammogram for malignant neoplasm of breast: Secondary | ICD-10-CM

## 2021-09-06 NOTE — Telephone Encounter (Signed)
Referral redirected to [facility]. S/w patient, she has been seen-Toni ?

## 2021-09-19 ENCOUNTER — Encounter: Payer: Self-pay | Admitting: Physician Assistant

## 2021-10-04 ENCOUNTER — Other Ambulatory Visit: Payer: Self-pay | Admitting: Physician Assistant

## 2021-10-04 DIAGNOSIS — F331 Major depressive disorder, recurrent, moderate: Secondary | ICD-10-CM

## 2021-10-04 DIAGNOSIS — F411 Generalized anxiety disorder: Secondary | ICD-10-CM

## 2021-10-11 ENCOUNTER — Encounter: Payer: Self-pay | Admitting: Physician Assistant

## 2021-10-11 ENCOUNTER — Ambulatory Visit (INDEPENDENT_AMBULATORY_CARE_PROVIDER_SITE_OTHER): Payer: BC Managed Care – PPO | Admitting: Physician Assistant

## 2021-10-11 DIAGNOSIS — F331 Major depressive disorder, recurrent, moderate: Secondary | ICD-10-CM | POA: Diagnosis not present

## 2021-10-11 DIAGNOSIS — E039 Hypothyroidism, unspecified: Secondary | ICD-10-CM

## 2021-10-11 DIAGNOSIS — R7303 Prediabetes: Secondary | ICD-10-CM

## 2021-10-11 DIAGNOSIS — F411 Generalized anxiety disorder: Secondary | ICD-10-CM

## 2021-10-11 DIAGNOSIS — R030 Elevated blood-pressure reading, without diagnosis of hypertension: Secondary | ICD-10-CM

## 2021-10-11 DIAGNOSIS — Z6841 Body Mass Index (BMI) 40.0 and over, adult: Secondary | ICD-10-CM

## 2021-10-11 MED ORDER — RYBELSUS 3 MG PO TABS: 3.0000 mg | ORAL_TABLET | Freq: Every day | ORAL | 2 refills | Status: DC

## 2021-10-11 MED ORDER — HYDROXYZINE PAMOATE 25 MG PO CAPS: 25.0000 mg | ORAL_CAPSULE | Freq: Three times a day (TID) | ORAL | 0 refills | Status: AC | PRN

## 2021-10-11 NOTE — Progress Notes (Signed)
[facility] [address] [place], [place] [address]  Internal MEDICINE  Office Visit Note  Patient Name: Stacy Chandler  [id number]  [id number]  Date of Service: 10/25/2021  Chief Complaint  Patient presents with   Generalized Body Aches   Hot Flashes   Eczema    Having dry skin   Medication Problem    Pt thinks Levothyroxine is causing all of these symptoms - anxiety is worse    HPI Pt is here for sick visit -Anxiety has been worse and having more body aches, sweating more, and dry skin for the past month -questioned whether her thyroid medication could be the cause despite being on it for several months, but she is overdue for repeat testing since starting synthroid and will check this now -Gradually getting worse -Seeing counselor now -Will send hydroxyzine to be taken as needed for anxiety due to worsening.  -tolerating crestor twice per week -She also thinks her weight and untreated OSA may also be playing a role in her bodyaches and how she is feeling. She unfortunately has been unable to afford cpap and is working toward this -Her A1c is abnormal and will therefore try starting rybelsus to aid BG and weight. Rybelsus sample given -BP very elevated in office likely due to patient being very upset and anxious. She will monitor this closely and call if continued elevation  Current Medication: Outpatient Encounter Medications as of 10/11/2021  Medication Sig   escitalopram (LEXAPRO) 20 MG tablet Take 2 tab po daily   hydrOXYzine (VISTARIL) 25 MG capsule Take 1 capsule (25 mg total) by mouth every 8 (eight) hours as needed.   LARIN 24 FE 1-20 MG-MCG(24) tablet Take 1 tablet by mouth once daily   levothyroxine (SYNTHROID) 25 MCG tablet Take 1 tablet (25 mcg total) by mouth daily.   rosuvastatin (CRESTOR) 5 MG tablet Take 1 tablet (5 mg total) by mouth daily.   Semaglutide (RYBELSUS) 3 MG TABS Take 3 mg by mouth daily.   venlafaxine (EFFEXOR) 75 MG tablet Take 1  tablet by mouth once daily   No facility-administered encounter medications on file as of 10/11/2021.    Surgical History: Past Surgical History:  Procedure Laterality Date   CESAREAN SECTION     ELBOW ARTHROPLASTY Left     Medical History: Past Medical History:  Diagnosis Date   Depression     Family History: Family History  Problem Relation Age of Onset   Heart disease Mother    Diabetes Mother    Cancer Mother    Schizophrenia Brother    Cancer Maternal Grandmother    Cancer Paternal Grandmother     Social History   Socioeconomic History   Marital status: Divorced    Spouse name: Not on file   Number of children: Not on file   Years of education: Not on file   Highest education level: Not on file  Occupational History   Not on file  Tobacco Use   Smoking status: Never   Smokeless tobacco: Never  Substance and Sexual Activity   Alcohol use: Yes   Drug use: No   Sexual activity: Not on file  Other Topics Concern   Not on file  Social History Narrative   Not on file   Social Determinants of Health   Financial Resource Strain: Not on file  Food Insecurity: Not on file  Transportation Needs: Not on file  Physical Activity: Not on file  Stress: Not on file  Social  Connections: Not on file  Intimate Partner Violence: Not on file      Review of Systems  Constitutional:  Positive for fatigue. Negative for chills and unexpected weight change.  HENT:  Negative for congestion, postnasal drip, rhinorrhea, sneezing and sore throat.   Eyes:  Negative for redness.  Respiratory:  Negative for cough, chest tightness and shortness of breath.   Cardiovascular:  Negative for chest pain and palpitations.  Gastrointestinal:  Negative for abdominal pain, constipation, diarrhea, nausea and vomiting.  Genitourinary:  Negative for dysuria and frequency.  Musculoskeletal:  Positive for arthralgias and back pain. Negative for joint swelling and neck pain.  Skin:   Negative for rash.  Neurological: Negative.  Negative for tremors and numbness.  Hematological:  Negative for adenopathy. Does not bruise/bleed easily.  Psychiatric/Behavioral:  Positive for behavioral problems (Depression) and sleep disturbance. Negative for suicidal ideas. The patient is nervous/anxious.     Vital Signs: BP (!) 178/80 Comment: 180/93  Pulse 90   Temp 98.4 F (36.9 C)   Resp 16   Ht 5\' 2"  (1.575 m)   Wt 262 lb 6.4 oz (119 kg)   SpO2 98%   BMI 47.99 kg/m    Physical Exam Vitals and nursing note reviewed.  Constitutional:      General: She is not in acute distress.    Appearance: She is well-developed. She is obese. She is not diaphoretic.  HENT:     Head: Normocephalic and atraumatic.     Mouth/Throat:     Pharynx: No oropharyngeal exudate.  Eyes:     Pupils: Pupils are equal, round, and reactive to light.  Neck:     Thyroid: No thyromegaly.     Vascular: No JVD.     Trachea: No tracheal deviation.  Cardiovascular:     Rate and Rhythm: Normal rate and regular rhythm.     Heart sounds: Normal heart sounds. No murmur heard.    No friction rub. No gallop.  Pulmonary:     Effort: Pulmonary effort is normal. No respiratory distress.     Breath sounds: No wheezing or rales.  Chest:     Chest wall: No tenderness.  Abdominal:     General: Bowel sounds are normal.     Palpations: Abdomen is soft.  Musculoskeletal:        General: Normal range of motion.     Cervical back: Normal range of motion and neck supple.  Lymphadenopathy:     Cervical: No cervical adenopathy.  Skin:    General: Skin is warm and dry.  Neurological:     Mental Status: She is alert and oriented to person, place, and time.     Cranial Nerves: No cranial nerve deficit.  Psychiatric:        Thought Content: Thought content normal.        Judgment: Judgment normal.     Comments: Anxious and tearful in office        Assessment/Plan: 1. GAD (generalized anxiety disorder) Will  start hydroxyzine as needed and continue other meds as before. May need to consider adjusting meds in future if not working or establishing with psych if continued worsening. She already has appt shcedule in 1 month but will follow up sooner if not improving - hydrOXYzine (VISTARIL) 25 MG capsule; Take 1 capsule (25 mg total) by mouth every 8 (eight) hours as needed.  Dispense: 30 capsule; Refill: 0  2. Moderate episode of recurrent major depressive disorder [facility]) May continue current  medications and therapy, may need to adjust meds in future  3. Acquired hypothyroidism Will recheck labs previously ordered  4. Prediabetes Sample given with instructions and will start at 3mg  daily of rybelsus for BG and wt loss  - Semaglutide (RYBELSUS) 3 MG TABS; Take 3 mg by mouth daily.  Dispense: 30 tablet; Refill: 2  5. Elevated BP without diagnosis of hypertension Very elevated due to patient being very upset and anxious in office. Will monitor closely and contact office if continued elevation. Has appt in 1 month but will contact sooner if elevated.  6. Morbid obesity with BMI of 45.0-49.9, adult (HCC) Will start rybelsus to help BG and wt loss - Semaglutide (RYBELSUS) 3 MG TABS; Take 3 mg by mouth daily.  Dispense: 30 tablet; Refill: 2   General Counseling: fatoumata albaugh understanding of the findings of todays visit and agrees with plan of treatment. I have discussed any further diagnostic evaluation that may be needed or ordered today. We also reviewed her medications today. she has been encouraged to call the office with any questions or concerns that should arise related to todays visit.    No orders of the defined types were placed in this encounter.   Meds ordered this encounter  Medications   Semaglutide (RYBELSUS) 3 MG TABS    Sig: Take 3 mg by mouth daily.    Dispense:  30 tablet    Refill:  2   hydrOXYzine (VISTARIL) 25 MG capsule    Sig: Take 1 capsule (25 mg total) by mouth  every 8 (eight) hours as needed.    Dispense:  30 capsule    Refill:  0    This patient was seen by Rodman Key, PA-C in collaboration with Dr. Lynn Ito as a part of collaborative care agreement.   Total time spent:35 Minutes Time spent includes review of chart, medications, test results, and follow up plan with the patient.      Dr Beverely Risen Internal medicine

## 2021-10-11 NOTE — Telephone Encounter (Signed)
Pt  advised that need appt and gave call to toni to make appt

## 2021-10-12 LAB — CBC WITH DIFFERENTIAL/PLATELET
Basophils Absolute: 0.1 10*3/uL (ref 0.0–0.2)
Basos: 0 %
EOS (ABSOLUTE): 0.2 10*3/uL (ref 0.0–0.4)
Eos: 2 %
Hematocrit: 39 % (ref 34.0–46.6)
Hemoglobin: 12.8 g/dL (ref 11.1–15.9)
Immature Grans (Abs): 0.1 10*3/uL (ref 0.0–0.1)
Immature Granulocytes: 1 %
Lymphocytes Absolute: 3.7 10*3/uL — ABNORMAL HIGH (ref 0.7–3.1)
Lymphs: 25 %
MCH: 28.8 pg (ref 26.6–33.0)
MCHC: 32.8 g/dL (ref 31.5–35.7)
MCV: 88 fL (ref 79–97)
Monocytes Absolute: 0.9 10*3/uL (ref 0.1–0.9)
Monocytes: 6 %
Neutrophils Absolute: 10 10*3/uL — ABNORMAL HIGH (ref 1.4–7.0)
Neutrophils: 66 %
Platelets: 404 10*3/uL (ref 150–450)
RBC: 4.44 x10E6/uL (ref 3.77–5.28)
RDW: 13.6 % (ref 11.7–15.4)
WBC: 14.9 10*3/uL — ABNORMAL HIGH (ref 3.4–10.8)

## 2021-10-12 LAB — TSH+FREE T4
Free T4: 0.83 ng/dL (ref 0.82–1.77)
TSH: 2.15 u[IU]/mL (ref 0.450–4.500)

## 2021-10-15 ENCOUNTER — Encounter: Payer: Self-pay | Admitting: Physician Assistant

## 2021-10-15 ENCOUNTER — Telehealth: Payer: Self-pay

## 2021-10-15 NOTE — Telephone Encounter (Signed)
Called [facility] per lauren and added ANA direct with reflex if positive and also a rheumatoid factor test to patients labs she had drawn on 10/11/21.  Lauren requested the labs to be added due to patient is having worsening bodyaches/arthralgias.  Used dx code M25.50

## 2021-10-16 LAB — SPECIMEN STATUS REPORT

## 2021-10-16 LAB — RHEUMATOID FACTOR: Rheumatoid fact SerPl-aCnc: 10.4 IU/mL (ref <14.0)

## 2021-10-16 LAB — ANA W/REFLEX IF POSITIVE: Anti Nuclear Antibody (ANA): NEGATIVE

## 2021-10-18 ENCOUNTER — Telehealth: Payer: Self-pay

## 2021-10-18 ENCOUNTER — Other Ambulatory Visit: Payer: Self-pay | Admitting: Physician Assistant

## 2021-10-18 DIAGNOSIS — D72820 Lymphocytosis (symptomatic): Secondary | ICD-10-CM

## 2021-10-18 DIAGNOSIS — E039 Hypothyroidism, unspecified: Secondary | ICD-10-CM

## 2021-10-18 NOTE — Telephone Encounter (Signed)
Hematology referral faxed to [facility] per patient request-Toni

## 2021-10-18 NOTE — Telephone Encounter (Signed)
Spoke to pt and informed her of lab results and that Leotis Shames was referring her to hematology to have further eval and also she is having patient do an Ultrasound to evaluate further due to her symptoms

## 2021-10-28 ENCOUNTER — Telehealth: Payer: Self-pay

## 2021-10-28 NOTE — Telephone Encounter (Signed)
Lvm to schedule u/s-Toni 

## 2021-11-06 ENCOUNTER — Other Ambulatory Visit: Payer: Self-pay | Admitting: Physician Assistant

## 2021-11-06 DIAGNOSIS — Z3041 Encounter for surveillance of contraceptive pills: Secondary | ICD-10-CM

## 2021-11-14 ENCOUNTER — Ambulatory Visit: Payer: BC Managed Care – PPO | Admitting: Physician Assistant

## 2021-11-14 ENCOUNTER — Encounter: Payer: Self-pay | Admitting: Physician Assistant

## 2021-11-14 VITALS — BP 132/72 | HR 92 | Temp 98.3°F | Resp 16 | Ht 62.0 in | Wt 259.0 lb

## 2021-11-14 DIAGNOSIS — Z6841 Body Mass Index (BMI) 40.0 and over, adult: Secondary | ICD-10-CM

## 2021-11-14 DIAGNOSIS — R7303 Prediabetes: Secondary | ICD-10-CM

## 2021-11-14 DIAGNOSIS — F331 Major depressive disorder, recurrent, moderate: Secondary | ICD-10-CM | POA: Diagnosis not present

## 2021-11-14 DIAGNOSIS — F411 Generalized anxiety disorder: Secondary | ICD-10-CM

## 2021-11-14 DIAGNOSIS — E039 Hypothyroidism, unspecified: Secondary | ICD-10-CM

## 2021-11-14 LAB — POCT GLYCOSYLATED HEMOGLOBIN (HGB A1C): Hemoglobin A1C: 6.2 % — AB (ref 4.0–5.6)

## 2021-11-14 MED ORDER — RYBELSUS 7 MG PO TABS: 7.0000 mg | ORAL_TABLET | Freq: Every day | ORAL | 2 refills | Status: DC

## 2021-11-14 NOTE — Progress Notes (Signed)
[facility] [address] [place], [place] [address]  Internal MEDICINE  Office Visit Note  Patient Name: Stacy Chandler  [id number]  [id number]  Date of Service: 11/19/2021  Chief Complaint  Patient presents with   Follow-up   Depression    HPI Pt si here for routine follow up - been a busy day, was supposed to take brother for dental procedure but something went wrong and couldn't have this done so this was frustrating -Trigger finger and arthritis pain in hands, but is not interested in seeing ortho at this time -She has been doing better since last visit. Only takes hydroxyzine as needed, but is helpful. 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 -She is tolerating rybelsus and is interested in increasing to 7mg  dose to help with BG and wt loss as her A1c did go up slightly. She has lost 3lbs since last visit  Current Medication: Outpatient Encounter Medications as of 11/14/2021  Medication Sig   escitalopram (LEXAPRO) 20 MG tablet Take 2 tab po daily   hydrOXYzine (VISTARIL) 25 MG capsule Take 1 capsule (25 mg total) by mouth every 8 (eight) hours as needed.   LARIN 24 FE 1-20 MG-MCG(24) tablet Take 1 tablet by mouth once daily   levothyroxine (SYNTHROID) 25 MCG tablet Take 1 tablet (25 mcg total) by mouth daily.   rosuvastatin (CRESTOR) 5 MG tablet Take 1 tablet (5 mg total) by mouth daily.   Semaglutide (RYBELSUS) 7 MG TABS Take 7 mg by mouth daily.   venlafaxine (EFFEXOR) 75 MG tablet Take 1 tablet by mouth once daily   [DISCONTINUED] Semaglutide (RYBELSUS) 3 MG TABS Take 3 mg by mouth daily.   No facility-administered encounter medications on  file as of 11/14/2021.    Surgical History: Past Surgical History:  Procedure Laterality Date   CESAREAN SECTION     ELBOW ARTHROPLASTY Left     Medical History: Past Medical History:  Diagnosis Date   Depression     Family History: Family History  Problem Relation Age of Onset   Heart disease Mother    Diabetes Mother    Cancer Mother    Schizophrenia Brother    Cancer Maternal Grandmother    Cancer Paternal Grandmother     Social History   Socioeconomic History   Marital status: Divorced    Spouse name: Not on file   Number of children: Not on file   Years of education: Not on file   Highest education level: Not on file  Occupational History   Not on file  Tobacco Use   Smoking status: Never   Smokeless tobacco: Never  Substance and Sexual Activity   Alcohol use: Yes   Drug use: No   Sexual activity: Not on file  Other Topics Concern   Not on file  Social History Narrative   Not on file   Social Determinants of Health   Financial Resource Strain: Not on file  Food Insecurity: Not on file  Transportation Needs: Not on file  Physical Activity: Not on file  Stress: Not on file  Social Connections: Not on file  Intimate Partner Violence: Not on file  Review of Systems  Constitutional:  Positive for fatigue. Negative for chills and unexpected weight change.  HENT:  Negative for congestion, postnasal drip, rhinorrhea, sneezing and sore throat.   Eyes:  Negative for redness.  Respiratory:  Negative for cough, chest tightness and shortness of breath.   Cardiovascular:  Negative for chest pain and palpitations.  Gastrointestinal:  Negative for abdominal pain, constipation, diarrhea, nausea and vomiting.  Genitourinary:  Negative for dysuria and frequency.  Musculoskeletal:  Positive for arthralgias and back pain. Negative for joint swelling and neck pain.  Skin:  Negative for rash.  Neurological: Negative.  Negative for tremors and numbness.   Hematological:  Negative for adenopathy. Does not bruise/bleed easily.  Psychiatric/Behavioral:  Positive for behavioral problems (Depression) and sleep disturbance. Negative for suicidal ideas. The patient is nervous/anxious.     Vital Signs: BP 132/72 Comment: 140/92  Pulse 92   Temp 98.3 F (36.8 C)   Resp 16   Ht 5\' 2"  (1.575 m)   Wt 259 lb (117.5 kg)   SpO2 97%   BMI 47.37 kg/m    Physical Exam Vitals and nursing note reviewed.  Constitutional:      General: She is not in acute distress.    Appearance: Normal appearance. She is well-developed. She is obese. She is not diaphoretic.  HENT:     Head: Normocephalic and atraumatic.     Mouth/Throat:     Pharynx: No oropharyngeal exudate.  Eyes:     Pupils: Pupils are equal, round, and reactive to light.  Neck:     Thyroid: No thyromegaly.     Vascular: No JVD.     Trachea: No tracheal deviation.  Cardiovascular:     Rate and Rhythm: Normal rate and regular rhythm.     Heart sounds: Normal heart sounds. No murmur heard.    No friction rub. No gallop.  Pulmonary:     Effort: Pulmonary effort is normal. No respiratory distress.     Breath sounds: No wheezing or rales.  Chest:     Chest wall: No tenderness.  Abdominal:     General: Bowel sounds are normal.     Palpations: Abdomen is soft.  Musculoskeletal:        General: Normal range of motion.     Cervical back: Normal range of motion and neck supple.  Lymphadenopathy:     Cervical: No cervical adenopathy.  Skin:    General: Skin is warm and dry.  Neurological:     Mental Status: She is alert and oriented to person, place, and time.     Cranial Nerves: No cranial nerve deficit.  Psychiatric:        Thought Content: Thought content normal.        Judgment: Judgment normal.     Comments: anxious in office        Assessment/Plan: 1. Prediabetes - POCT HgB A1C is 6.2 which is slightly elevated from 6.1 last check. Will increase to rybelsus 7mg  to help BG  control and wt loss - Semaglutide (RYBELSUS) 7 MG TABS; Take 7 mg by mouth daily.  Dispense: 30 tablet; Refill: 2  2. Acquired hypothyroidism Continue synthroid as before, will check thyroid and consider increasing dose in future  3. GAD (generalized anxiety disorder) Continue current medications  4. Moderate episode of recurrent major depressive disorder (HCC) Continue current medications  5. Morbid obesity with BMI of 45.0-49.9, adult (HCC) Down 3 lbs since last visit and will continue to work on  diet and exercise. Will also raise rybelsus dose to help BG and wt loss benefits   General Counseling: sorah falkenstein understanding of the findings of todays visit and agrees with plan of treatment. I have discussed any further diagnostic evaluation that may be needed or ordered today. We also reviewed her medications today. she has been encouraged to call the office with any questions or concerns that should arise related to todays visit.    Orders Placed This Encounter  Procedures   POCT HgB A1C    Meds ordered this encounter  Medications   Semaglutide (RYBELSUS) 7 MG TABS    Sig: Take 7 mg by mouth daily.    Dispense:  30 tablet    Refill:  2    This patient was seen by Lynn Ito, PA-C in collaboration with Dr. Beverely Risen as a part of collaborative care agreement.   Total time spent:30 Minutes Time spent includes review of chart, medications, test results, and follow up plan with the patient.      Dr Lyndon Code Internal medicine

## 2021-12-03 ENCOUNTER — Telehealth: Payer: Self-pay

## 2021-12-03 NOTE — Telephone Encounter (Signed)
Lvm to confirm 12/09/21 appointment-Stacy Chandler 

## 2021-12-09 ENCOUNTER — Ambulatory Visit (INDEPENDENT_AMBULATORY_CARE_PROVIDER_SITE_OTHER): Payer: BC Managed Care – PPO

## 2021-12-09 DIAGNOSIS — E039 Hypothyroidism, unspecified: Secondary | ICD-10-CM

## 2021-12-17 ENCOUNTER — Telehealth: Payer: Self-pay

## 2021-12-19 ENCOUNTER — Telehealth (INDEPENDENT_AMBULATORY_CARE_PROVIDER_SITE_OTHER): Payer: BC Managed Care – PPO | Admitting: Physician Assistant

## 2021-12-19 VITALS — Ht 62.0 in | Wt 256.0 lb

## 2021-12-19 DIAGNOSIS — E039 Hypothyroidism, unspecified: Secondary | ICD-10-CM | POA: Diagnosis not present

## 2021-12-19 DIAGNOSIS — R7303 Prediabetes: Secondary | ICD-10-CM | POA: Diagnosis not present

## 2021-12-19 MED ORDER — RYBELSUS 14 MG PO TABS: 14.0000 mg | ORAL_TABLET | Freq: Every day | ORAL | 2 refills | Status: AC

## 2021-12-19 NOTE — Progress Notes (Signed)
[facility] [address] [place], [place] [address]  Internal MEDICINE  Telephone Visit  Patient Name: Stacy Chandler  [id number]  [id number]  Date of Service: 12/26/2021  I connected with the patient at 1:42 by telephone and verified the patients identity using two identifiers.   I discussed the limitations, risks, security and privacy concerns of performing an evaluation and management service by telephone and the availability of in person appointments. I also discussed with the patient that there may be a patient responsible charge related to the service.  The patient expressed understanding and agrees to proceed.    Chief Complaint  Patient presents with   Telephone Assessment    [id number] video   Telephone Screen   Follow-up    U/s     HPI Pt is here for virtual follow up due to work schedule -Still having low back pain and hand pain. Has tried ibuprofen. -Had her hematology visit, goes back in 2 months -rybelsus is helping and would like to increase dose -Thyroid US was normal -Synthroid put her barely in range and is ok with trying to increase now that thyroid US has been checked. Will double for total and she will call when higher dose script needed. Will order repeat blood work including antibody labs to be done in 6-8 weeks prior to follow up  Current Medication: Outpatient Encounter Medications as of 12/19/2021  Medication Sig   escitalopram (LEXAPRO) 20 MG tablet Take 2 tab po daily   hydrOXYzine (VISTARIL) 25 MG capsule Take 1 capsule (25 mg total) by mouth every 8 (eight) hours as needed.   LARIN 24 FE 1-20 MG-MCG(24) tablet Take 1 tablet by mouth once daily   levothyroxine (SYNTHROID) 25 MCG tablet Take 1 tablet (25 mcg total) by mouth daily.   rosuvastatin (CRESTOR) 5 MG tablet Take 1 tablet (5 mg total) by mouth daily.   Semaglutide (RYBELSUS) 14 MG TABS Take 1 tablet (14 mg total) by mouth daily.   venlafaxine (EFFEXOR) 75 MG tablet  Take 1 tablet by mouth once daily   [DISCONTINUED] Semaglutide (RYBELSUS) 7 MG TABS Take 7 mg by mouth daily.   No facility-administered encounter medications on file as of 12/19/2021.    Surgical History: Past Surgical History:  Procedure Laterality Date   CESAREAN SECTION     ELBOW ARTHROPLASTY Left     Medical History: Past Medical History:  Diagnosis Date   Depression     Family History: Family History  Problem Relation Age of Onset   Heart disease Mother    Diabetes Mother    Cancer Mother    Schizophrenia Brother    Cancer Maternal Grandmother    Cancer Paternal Grandmother     Social History   Socioeconomic History   Marital status: Divorced    Spouse name: Not on file   Number of children: Not on file   Years of education: Not on file   Highest education level: Not on file  Occupational History   Not on file  Tobacco Use   Smoking status: Never   Smokeless tobacco: Never  Substance and Sexual Activity   Alcohol use: Yes   Drug use: No   Sexual activity: Not on file  Other Topics Concern   Not on file  Social History Narrative   Not on file   Social Determinants of Health   Financial Resource Strain: Not on file  Food Insecurity: Not on file  Transportation Needs: Not on file  Physical Activity: Not on file  Stress: Not on file  Social Connections: Not on file  Intimate Partner Violence: Not on file      Review of Systems  Constitutional:  Positive for fatigue. Negative for chills and unexpected weight change.  HENT:  Negative for congestion, postnasal drip, rhinorrhea, sneezing and sore throat.   Eyes:  Negative for redness.  Respiratory:  Negative for cough, chest tightness and shortness of breath.   Cardiovascular:  Negative for chest pain and palpitations.  Gastrointestinal:  Negative for abdominal pain, constipation, diarrhea, nausea and vomiting.  Genitourinary:  Negative for dysuria and frequency.  Musculoskeletal:  Positive for  arthralgias and back pain. Negative for joint swelling and neck pain.  Skin:  Negative for rash.  Neurological: Negative.  Negative for tremors and numbness.  Hematological:  Negative for adenopathy. Does not bruise/bleed easily.  Psychiatric/Behavioral:  Positive for behavioral problems (Depression) and sleep disturbance. Negative for suicidal ideas. The patient is nervous/anxious.     Vital Signs: Ht 5\' 2"  (1.575 m)   Wt 256 lb (116.1 kg)   BMI 46.82 kg/m    Observation/Objective:  Pt is able to carry out conversation   Assessment/Plan: 1. Prediabetes Will increase to 14mg  Rybelsus and continue to work on diet and exercise - Semaglutide (RYBELSUS) 14 MG TABS; Take 1 tablet (14 mg total) by mouth daily.  Dispense: 30 tablet; Refill: 2  2. Acquired hypothyroidism Will increase to synthroid by doubling her 25 dose and will call when new script ready. Will recheck labs in 2 months - TSH + free T4 - Thyroid peroxidase antibody - Thyroglobulin antibody   General Counseling: Amoura verbalizes understanding of the findings of today's phone visit and agrees with plan of treatment. I have discussed any further diagnostic evaluation that may be needed or ordered today. We also reviewed her medications today. she has been encouraged to call the office with any questions or concerns that should arise related to todays visit.    Orders Placed This Encounter  Procedures   TSH + free T4   Thyroid peroxidase antibody   Thyroglobulin antibody    Meds ordered this encounter  Medications   Semaglutide (RYBELSUS) 14 MG TABS    Sig: Take 1 tablet (14 mg total) by mouth daily.    Dispense:  30 tablet    Refill:  2    Time spent:30 Minutes    Dr Internal medicine

## 2021-12-27 ENCOUNTER — Telehealth: Payer: Self-pay

## 2021-12-27 NOTE — Telephone Encounter (Signed)
LMOM to check with pt if a PA needs to be completed for RYBELSUS 14 mg.  Asked to call back on 12/31/21 to let us know

## 2021-12-27 NOTE — Telephone Encounter (Signed)
Error

## 2021-12-30 ENCOUNTER — Other Ambulatory Visit: Payer: Self-pay | Admitting: Physician Assistant

## 2021-12-30 DIAGNOSIS — F331 Major depressive disorder, recurrent, moderate: Secondary | ICD-10-CM

## 2021-12-30 DIAGNOSIS — F411 Generalized anxiety disorder: Secondary | ICD-10-CM

## 2022-08-21 ENCOUNTER — Encounter: Payer: BC Managed Care – PPO | Admitting: Physician Assistant

## 2022-09-12 IMAGING — MG MM DIGITAL SCREENING BILAT W/ TOMO AND CAD
6 of 10 series · 6 of 30 positions shown · non-contrast
Comparison: Previous exam(s).

CLINICAL DATA: Screening.

EXAM:
DIGITAL SCREENING BILATERAL MAMMOGRAM WITH TOMOSYNTHESIS AND CAD
TECHNIQUE: Bilateral screening digital craniocaudal and mediolateral oblique
mammograms were obtained. Bilateral screening digital breast
tomosynthesis was performed. The images were evaluated with
computer-aided detection.

[R CC synth-2D]
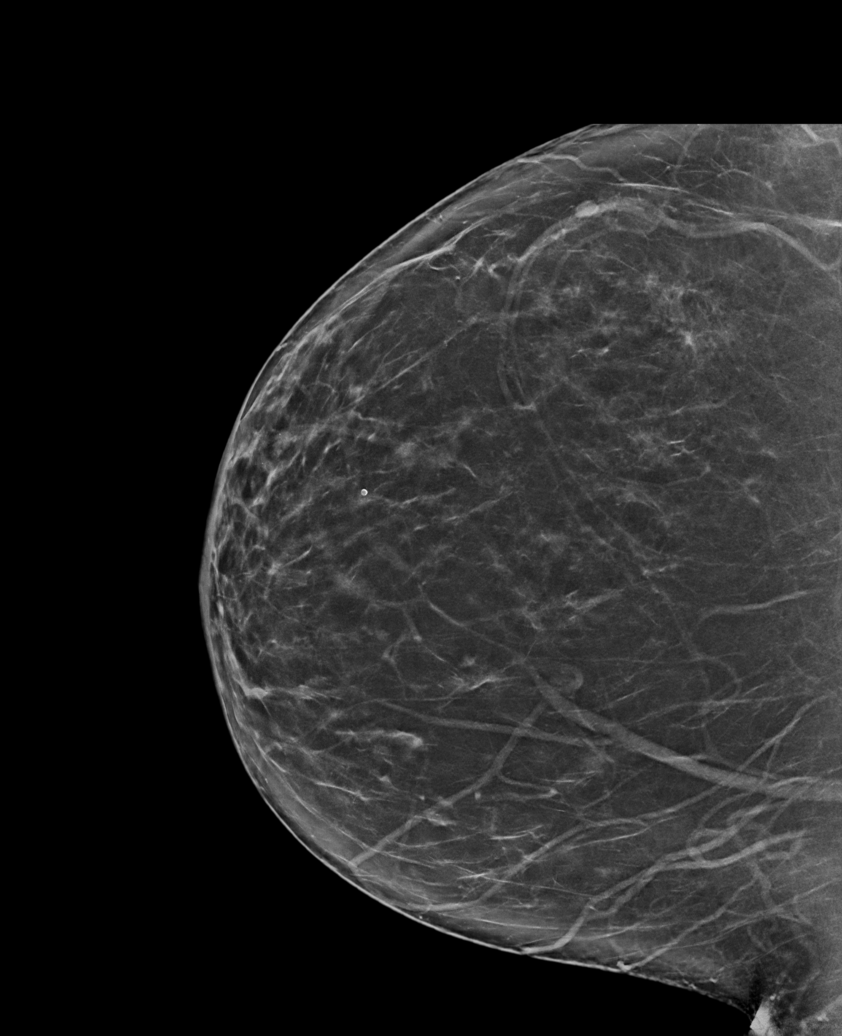

[L MLO synth-2D (1 of 2)]
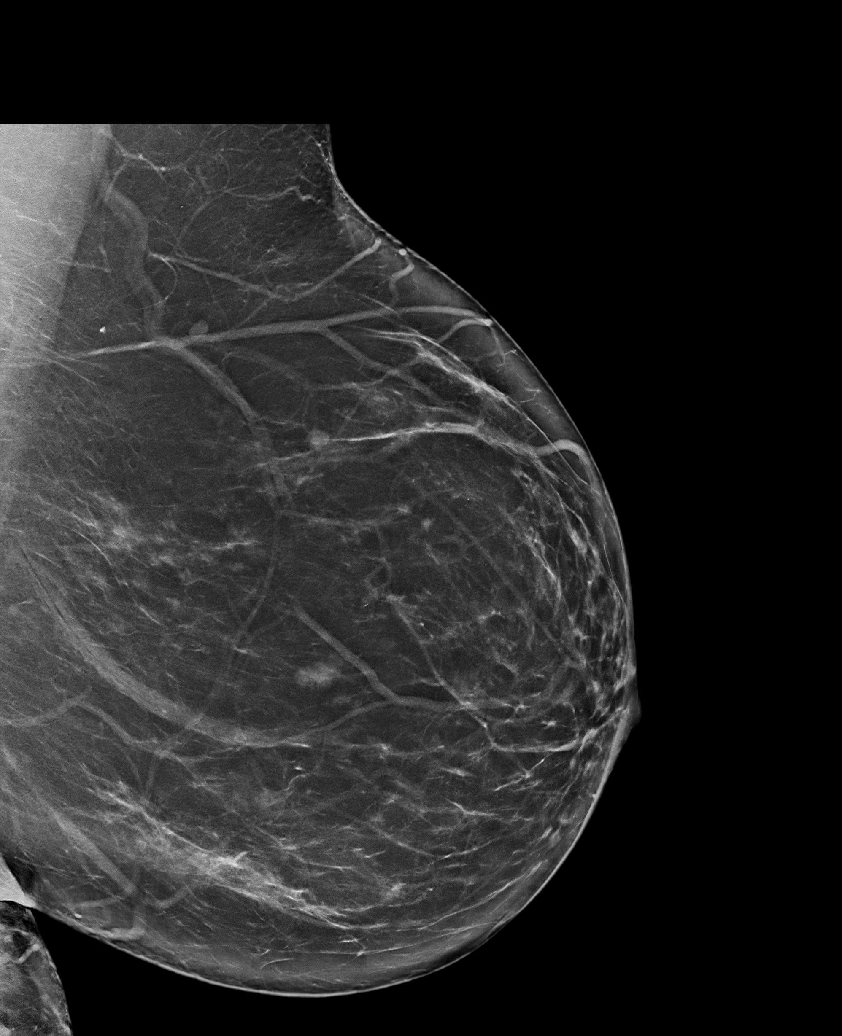

[R MLO synth-2D]
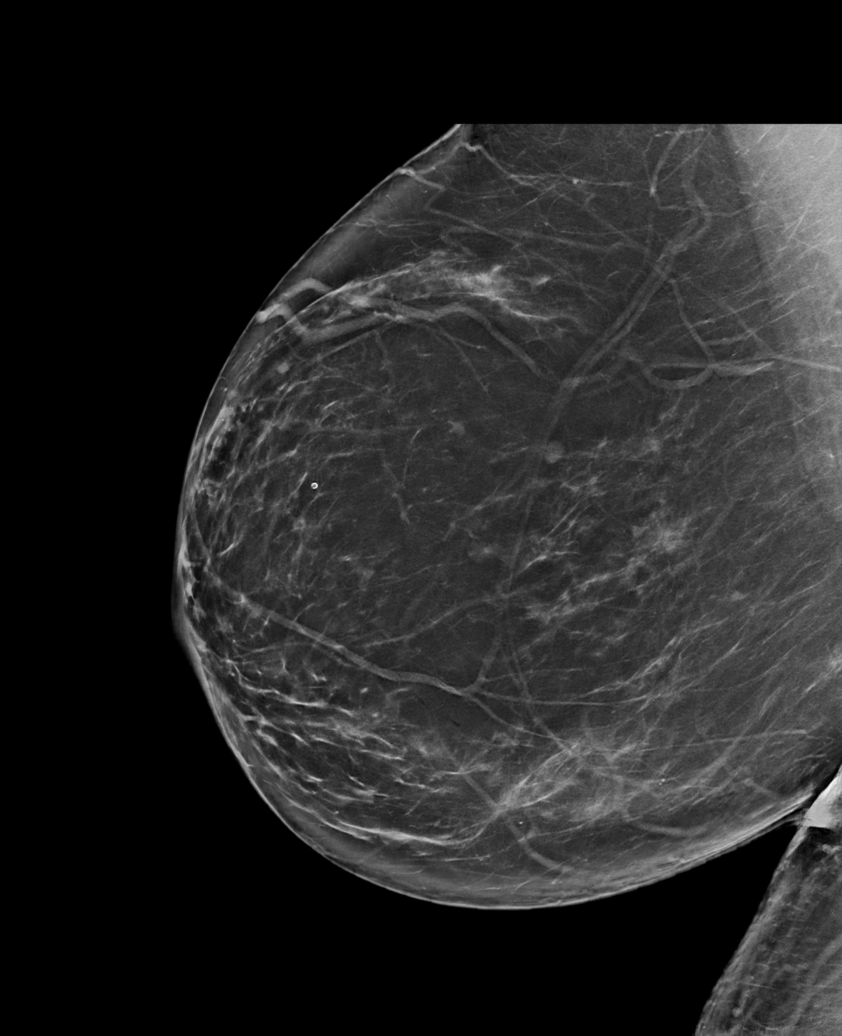

[L MLO synth-2D (2 of 2)]
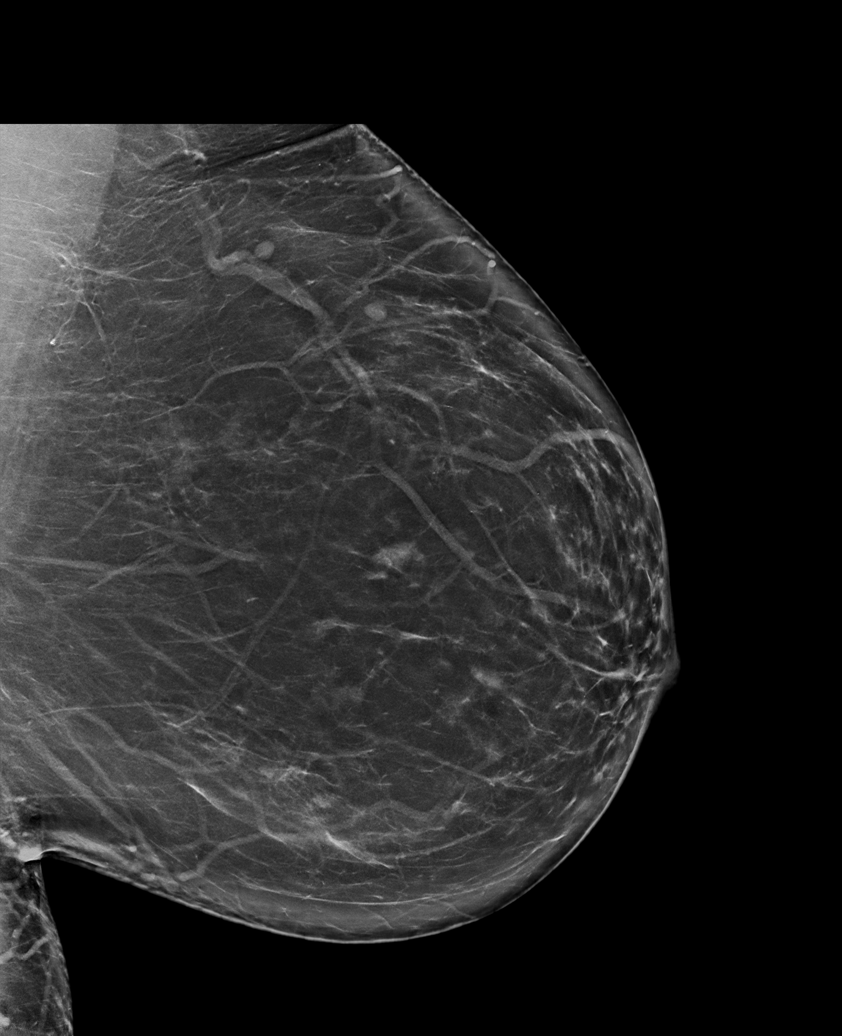

[L CC synth-2D]
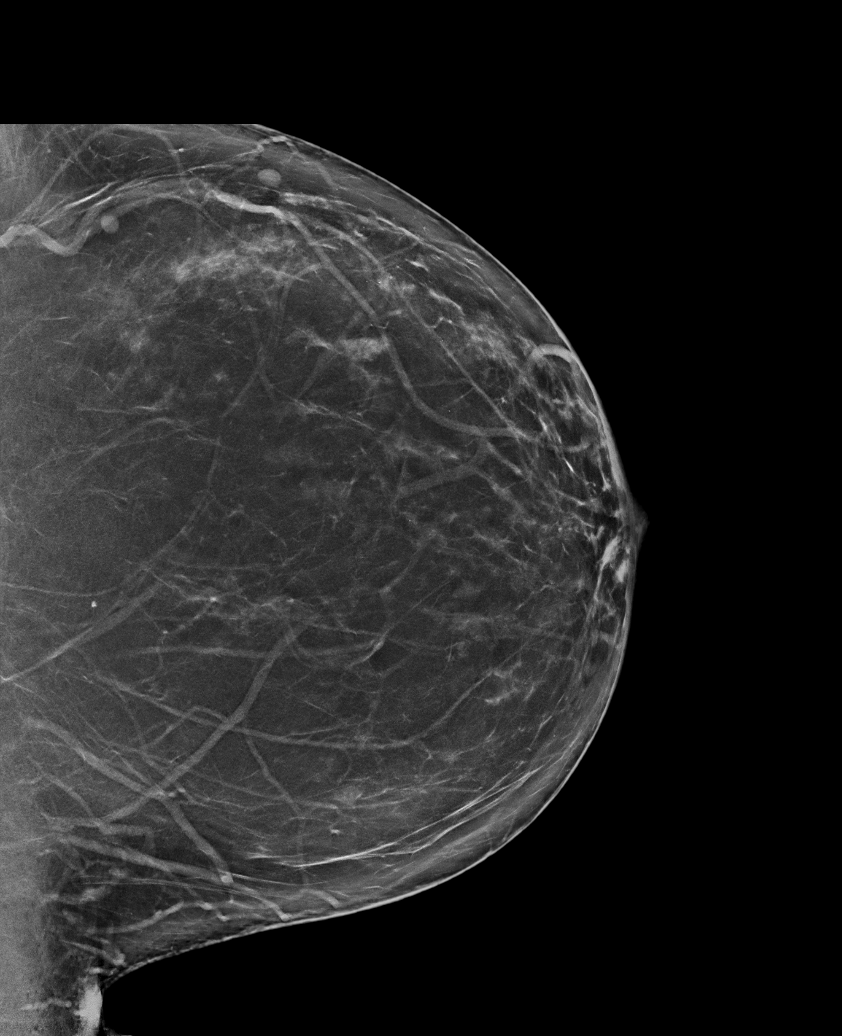

[R CC tomo · tomo slice 43/86.0]
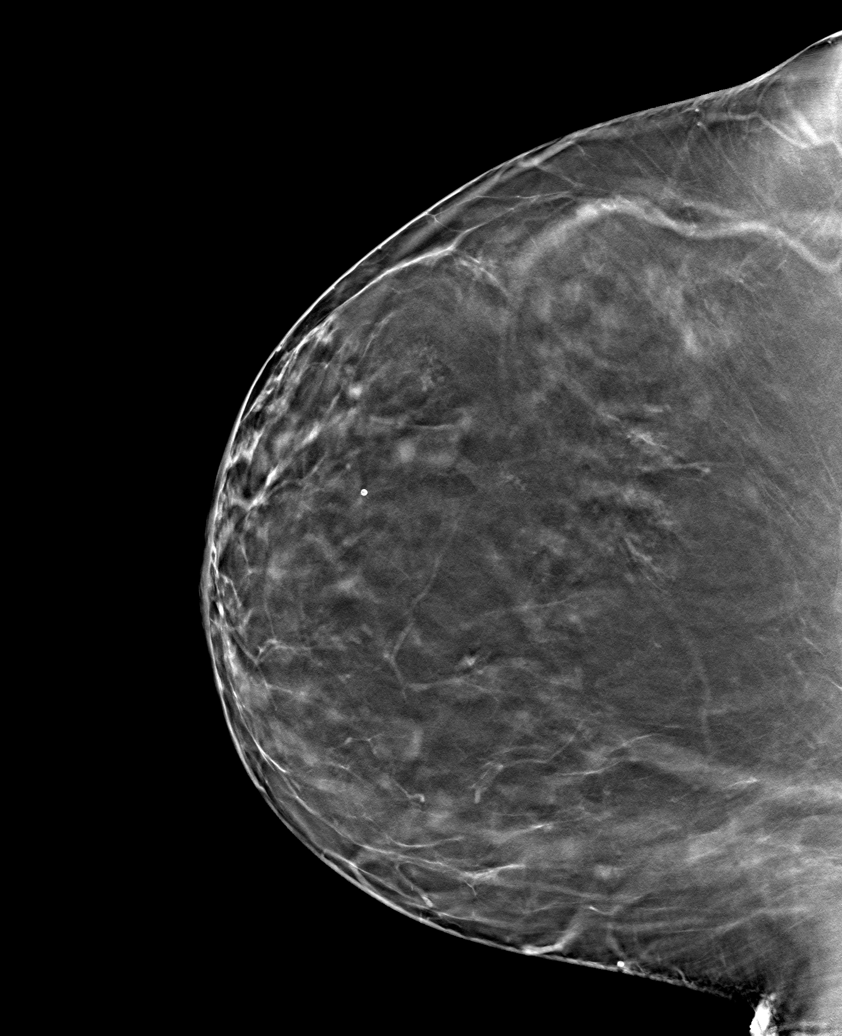

[6 of 30 positions shown; findings below may reference images not displayed]

ACR Breast Density Category b: There are scattered areas of
fibroglandular density.
FINDINGS: There are no findings suspicious for malignancy.
IMPRESSION: No mammographic evidence of malignancy. A result letter of this
screening mammogram will be mailed directly to the patient.

RECOMMENDATION:
Screening mammogram in one year. (Code:51-O-LD2)

BI-RADS CATEGORY  1: Negative.
